# Patient Record
Sex: Male | Born: 1965 | Race: White | Hispanic: No | Marital: Single | Smoking: Current some day smoker
Health system: Southern US, Community
[De-identification: ages and names within clinical notes are randomized; demographics above are authoritative.]

## PROBLEM LIST (undated history)

## (undated) DIAGNOSIS — E119 Type 2 diabetes mellitus without complications: Secondary | ICD-10-CM

## (undated) DIAGNOSIS — E7800 Pure hypercholesterolemia, unspecified: Secondary | ICD-10-CM

## (undated) DIAGNOSIS — I10 Essential (primary) hypertension: Secondary | ICD-10-CM

## (undated) DIAGNOSIS — K219 Gastro-esophageal reflux disease without esophagitis: Secondary | ICD-10-CM

## (undated) HISTORY — DX: Essential (primary) hypertension: I10

## (undated) HISTORY — DX: Pure hypercholesterolemia, unspecified: E78.00

## (undated) HISTORY — PX: OTHER SURGICAL HISTORY: SHX169

## (undated) HISTORY — DX: Type 2 diabetes mellitus without complications: E11.9

## (undated) HISTORY — DX: Gastro-esophageal reflux disease without esophagitis: K21.9

---

## 2012-05-27 DIAGNOSIS — I219 Acute myocardial infarction, unspecified: Secondary | ICD-10-CM

## 2012-05-27 HISTORY — DX: Acute myocardial infarction, unspecified: I21.9

## 2012-08-27 DIAGNOSIS — I10 Essential (primary) hypertension: Secondary | ICD-10-CM | POA: Insufficient documentation

## 2013-05-27 DIAGNOSIS — I2510 Atherosclerotic heart disease of native coronary artery without angina pectoris: Secondary | ICD-10-CM

## 2013-05-27 HISTORY — DX: Atherosclerotic heart disease of native coronary artery without angina pectoris: I25.10

## 2013-05-27 HISTORY — PX: CORONARY ANGIOPLASTY WITH STENT PLACEMENT: SHX49

## 2013-10-05 DIAGNOSIS — Z720 Tobacco use: Secondary | ICD-10-CM | POA: Insufficient documentation

## 2014-03-14 DIAGNOSIS — E66811 Obesity, class 1: Secondary | ICD-10-CM | POA: Insufficient documentation

## 2014-03-14 DIAGNOSIS — E669 Obesity, unspecified: Secondary | ICD-10-CM | POA: Insufficient documentation

## 2015-02-07 DIAGNOSIS — I213 ST elevation (STEMI) myocardial infarction of unspecified site: Secondary | ICD-10-CM

## 2015-02-07 HISTORY — DX: ST elevation (STEMI) myocardial infarction of unspecified site: I21.3

## 2015-02-20 DIAGNOSIS — Z955 Presence of coronary angioplasty implant and graft: Secondary | ICD-10-CM | POA: Insufficient documentation

## 2015-05-11 HISTORY — PX: COLONOSCOPY: SHX174

## 2016-10-13 DIAGNOSIS — S39012A Strain of muscle, fascia and tendon of lower back, initial encounter: Secondary | ICD-10-CM | POA: Insufficient documentation

## 2016-10-23 ENCOUNTER — Telehealth (HOSPITAL_COMMUNITY): Payer: Self-pay

## 2016-10-23 NOTE — Telephone Encounter (Signed)
Conformation of appt was faxed to MD office to [facility] @ [phone]. NF 10/23/16

## 2016-10-30 ENCOUNTER — Ambulatory Visit (HOSPITAL_COMMUNITY): Payer: Self-pay | Attending: Sports Medicine

## 2016-10-30 ENCOUNTER — Encounter (HOSPITAL_COMMUNITY): Payer: Self-pay

## 2016-10-30 DIAGNOSIS — M545 Low back pain, unspecified: Secondary | ICD-10-CM

## 2016-10-30 DIAGNOSIS — R293 Abnormal posture: Secondary | ICD-10-CM

## 2016-10-30 DIAGNOSIS — R262 Difficulty in walking, not elsewhere classified: Secondary | ICD-10-CM
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
## 2016-10-30 NOTE — Patient Instructions (Signed)
  LOWER TRUNK ROTATIONS - LTR  Lying on your back with your knees bent, gently move your knees side-to-side.  Perform 1x/day 2-3 sets of 10 reps to each side. Can perform more during the day if you feel like it is helping   SINGLE KNEE TO CHEST STRETCH - SKTC  While Lying on your back, hold your knee and gently pull it up towards your chest.  Perform 1x/day, 1 to 2 sets of 10 reps on each side, holding for 10-15 seconds each   Child pose  On hands and knees, rock back onto heels stretching back as far as comfortable.  Perform 1x/day 5-10 reps, holding for 15-30 seconds each

## 2016-11-01 ENCOUNTER — Ambulatory Visit (HOSPITAL_COMMUNITY): Payer: Self-pay | Admitting: Physical Therapy

## 2016-11-01 DIAGNOSIS — M545 Low back pain, unspecified: Secondary | ICD-10-CM

## 2016-11-01 DIAGNOSIS — R293 Abnormal posture: Secondary | ICD-10-CM

## 2016-11-01 DIAGNOSIS — R262 Difficulty in walking, not elsewhere classified: Secondary | ICD-10-CM

## 2016-11-01 NOTE — Patient Instructions (Addendum)
Hamstring Stretch: Active    Support behind right knee. Starting with knee bent, attempt to straighten knee until a comfortable stretch is felt in back of thigh. Hold __30__ seconds. Repeat _3___ times per set. Do __1__ sets per session. Do ___2_ sessions per day.  http://orth.exer.us/158   Copyright  VHI. All rights reserved.  Bridging    Slowly raise buttocks from floor, keeping stomach tight. Repeat _10___ times per set. Do __1__ sets per session. Do ___2_ sessions per day.  http://orth.exer.us/1096   Copyright  VHI. All rights reserved.  Hip Abduction / Adduction: with Knee Flexion (Supine)    With right knee bent, gently lower knee to side and return. Repeat __10__ times per set. Do ___1_ sets per session. Do __2__ sessions per day.  http://orth.exer.us/682   Copyright  VHI. All rights reserved.  Getting Into / Out of Bed    Lower self to lie down on one side by raising legs and lowering head at the same time. Use arms to assist moving without twisting. Bend both knees to roll onto back if desired. To sit up, start from lying on side, and use same move-ments in reverse. Keep trunk aligned with legs.   Copyright  VHI. All rights reserved.  Stand to Sit / Sit to Stand    To sit: Bend knees to lower self onto front edge of chair, then scoot back on seat. To stand: Reverse sequence by placing one foot forward, and scoot to front of seat. Sit up straight and bring your shoulders over your knees; then use your legs and stand up  Copyright  VHI. All rights reserved.
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
## 2016-11-05 ENCOUNTER — Ambulatory Visit (HOSPITAL_COMMUNITY): Payer: Self-pay | Admitting: Physical Therapy

## 2016-11-05 DIAGNOSIS — M545 Low back pain, unspecified: Secondary | ICD-10-CM

## 2016-11-05 DIAGNOSIS — R293 Abnormal posture: Secondary | ICD-10-CM

## 2016-11-05 DIAGNOSIS — R262 Difficulty in walking, not elsewhere classified: Secondary | ICD-10-CM
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
## 2016-11-07 ENCOUNTER — Ambulatory Visit (HOSPITAL_COMMUNITY): Payer: Self-pay

## 2016-11-07 ENCOUNTER — Encounter (HOSPITAL_COMMUNITY): Payer: Self-pay

## 2016-11-07 DIAGNOSIS — R262 Difficulty in walking, not elsewhere classified: Secondary | ICD-10-CM

## 2016-11-07 DIAGNOSIS — M545 Low back pain, unspecified: Secondary | ICD-10-CM

## 2016-11-07 DIAGNOSIS — R293 Abnormal posture: Secondary | ICD-10-CM
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
## 2016-11-12 ENCOUNTER — Encounter (HOSPITAL_COMMUNITY): Payer: Self-pay

## 2016-11-12 ENCOUNTER — Ambulatory Visit (HOSPITAL_COMMUNITY): Payer: Self-pay

## 2016-11-12 DIAGNOSIS — R293 Abnormal posture: Secondary | ICD-10-CM

## 2016-11-12 DIAGNOSIS — M545 Low back pain, unspecified: Secondary | ICD-10-CM

## 2016-11-12 DIAGNOSIS — R262 Difficulty in walking, not elsewhere classified: Secondary | ICD-10-CM
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
## 2016-11-19 ENCOUNTER — Encounter (HOSPITAL_COMMUNITY): Payer: Self-pay | Admitting: Physical Therapy

## 2018-10-06 DIAGNOSIS — I10 Essential (primary) hypertension: Secondary | ICD-10-CM | POA: Diagnosis not present

## 2018-10-06 DIAGNOSIS — E1165 Type 2 diabetes mellitus with hyperglycemia: Secondary | ICD-10-CM | POA: Diagnosis not present

## 2018-10-06 DIAGNOSIS — Z125 Encounter for screening for malignant neoplasm of prostate: Secondary | ICD-10-CM | POA: Diagnosis not present

## 2018-11-02 DIAGNOSIS — I10 Essential (primary) hypertension: Secondary | ICD-10-CM | POA: Diagnosis not present

## 2018-11-02 DIAGNOSIS — Z125 Encounter for screening for malignant neoplasm of prostate: Secondary | ICD-10-CM | POA: Diagnosis not present

## 2018-11-02 DIAGNOSIS — E1165 Type 2 diabetes mellitus with hyperglycemia: Secondary | ICD-10-CM | POA: Diagnosis not present

## 2018-12-22 DIAGNOSIS — E119 Type 2 diabetes mellitus without complications: Secondary | ICD-10-CM | POA: Diagnosis not present

## 2018-12-22 DIAGNOSIS — I2510 Atherosclerotic heart disease of native coronary artery without angina pectoris: Secondary | ICD-10-CM | POA: Diagnosis not present

## 2018-12-22 DIAGNOSIS — I10 Essential (primary) hypertension: Secondary | ICD-10-CM | POA: Diagnosis not present

## 2018-12-22 DIAGNOSIS — Z7984 Long term (current) use of oral hypoglycemic drugs: Secondary | ICD-10-CM | POA: Diagnosis not present

## 2019-01-18 DIAGNOSIS — I2510 Atherosclerotic heart disease of native coronary artery without angina pectoris: Secondary | ICD-10-CM | POA: Diagnosis not present

## 2019-06-04 ENCOUNTER — Ambulatory Visit: Payer: BC Managed Care – PPO | Attending: Internal Medicine

## 2019-06-04 ENCOUNTER — Other Ambulatory Visit: Payer: Self-pay

## 2019-06-04 DIAGNOSIS — Z20822 Contact with and (suspected) exposure to covid-19: Secondary | ICD-10-CM

## 2019-06-05 LAB — NOVEL CORONAVIRUS, NAA: SARS-CoV-2, NAA: NOT DETECTED

## 2019-06-08 ENCOUNTER — Telehealth: Payer: Self-pay | Admitting: *Deleted

## 2019-06-08 NOTE — Telephone Encounter (Signed)
He called in requesting his COVID-19 test result.    I let him know it was not detected meaning he did not have the virus.  I sent him a link to sign up for MyChart.

## 2019-11-15 ENCOUNTER — Encounter (INDEPENDENT_AMBULATORY_CARE_PROVIDER_SITE_OTHER): Payer: Self-pay | Admitting: Nurse Practitioner

## 2019-11-15 ENCOUNTER — Other Ambulatory Visit: Payer: Self-pay

## 2019-11-15 ENCOUNTER — Ambulatory Visit (HOSPITAL_COMMUNITY)
Admission: RE | Admit: 2019-11-15 | Discharge: 2019-11-15 | Disposition: A | Discharge status: Home / Self Care | Payer: BC Managed Care – PPO | Source: Ambulatory Visit | Attending: Nurse Practitioner | Admitting: Nurse Practitioner

## 2019-11-15 ENCOUNTER — Ambulatory Visit (INDEPENDENT_AMBULATORY_CARE_PROVIDER_SITE_OTHER): Payer: BC Managed Care – PPO | Admitting: Nurse Practitioner

## 2019-11-15 VITALS — BP 140/90 | HR 95 | Temp 97.3°F | Ht 69.0 in | Wt 221.2 lb

## 2019-11-15 DIAGNOSIS — R05 Cough: Secondary | ICD-10-CM

## 2019-11-15 DIAGNOSIS — R053 Chronic cough: Secondary | ICD-10-CM

## 2019-11-15 DIAGNOSIS — I2510 Atherosclerotic heart disease of native coronary artery without angina pectoris: Secondary | ICD-10-CM

## 2019-11-15 DIAGNOSIS — I10 Essential (primary) hypertension: Secondary | ICD-10-CM

## 2019-11-15 DIAGNOSIS — E785 Hyperlipidemia, unspecified: Secondary | ICD-10-CM | POA: Diagnosis not present

## 2019-11-15 DIAGNOSIS — H6123 Impacted cerumen, bilateral: Secondary | ICD-10-CM

## 2019-11-15 DIAGNOSIS — J984 Other disorders of lung: Secondary | ICD-10-CM | POA: Diagnosis not present

## 2019-11-15 DIAGNOSIS — R7303 Prediabetes: Secondary | ICD-10-CM

## 2019-11-15 DIAGNOSIS — J90 Pleural effusion, not elsewhere classified: Secondary | ICD-10-CM | POA: Diagnosis not present

## 2019-11-15 NOTE — Progress Notes (Signed)
Subjective:  Patient ID: Gavin Harris, male    DOB: 08-23-1965  Age: 54 y.o. MRN: [id number]  CC:  Chief Complaint  Patient presents with  . Hypertension  . Establish Care  . right ear full of wax      HPI  This patient arrives today to establish care in this office.  He tells me he has not seen a primary care provider in approximately 1 year.  He tells me he was getting his care in [place], however he has been spending much of his time closer to [place] in the recent past.  He was recommended to come to our office by his significant other.  He tells me that he feels he has quite a bit of earwax in his right ear and is wondering if this can be removed.  He also mentions that he has a chronic intermittent cough that will sometimes be dry and will sometimes produce sputum.  He denies any shortness of breath, chest pain, exercise intolerance, but he is wondering if he can have an x-ray completed mainly because his girlfriend is concerned.  He does report a 39-year history of smoking and tells me that on average he has smoked 1 pack/day.  Is to make his pack year approximately 70.   History reviewed. No pertinent past medical history.    History reviewed. No pertinent family history.  Social History   Social History Narrative  . Not on file   Social History   Tobacco Use  . Smoking status: Light Tobacco Smoker    Types: Cigarettes  . Smokeless tobacco: Never Used  Substance Use Topics  . Alcohol use: Not Currently     Current Meds  Medication Sig  . aspirin 81 MG EC tablet Take 81 mg by mouth daily.  Marland Kitchen atorvastatin (LIPITOR) 80 MG tablet Take 80 mg by mouth daily.  . clopidogrel (PLAVIX) 75 MG tablet Take 75 mg by mouth daily.  Marland Kitchen glipiZIDE (GLUCOTROL) 5 MG tablet Take 5 mg by mouth daily.  Marland Kitchen losartan-hydrochlorothiazide (HYZAAR) 100-12.5 MG tablet Take 1 tablet by mouth daily.  . metFORMIN (GLUCOPHAGE) 1000 MG tablet Take 1,000 mg by mouth 2 (two) times  daily.  . Multiple Vitamin (MULTI-VITAMIN) tablet Take 1 tablet by mouth daily.  Marland Kitchen OVER THE COUNTER MEDICATION Take 3,000 mg by mouth daily. Nugenix Ultimate    ROS:  Review of Systems  Constitutional: Negative for fever and malaise/fatigue.  HENT:       (+)ear wax to right ear  Eyes: Negative for blurred vision.  Respiratory: Positive for cough (intermittent; usually dry) and sputum production (intermittent ). Negative for shortness of breath and wheezing.   Cardiovascular: Negative for chest pain.  Gastrointestinal: Negative for abdominal pain and blood in stool.  Neurological: Negative for dizziness and headaches.     Objective:   Today's Vitals: BP 140/90 (BP Location: Left Arm, Patient Position: Sitting, Cuff Size: Normal)   Pulse 95   Temp (!) 97.3 F (36.3 C) (Temporal)   Ht _0  (1.753 m)   Wt 221 lb 3.2 oz (100.3 kg)   SpO2 97%   BMI 32.67 kg/m  Vitals with BMI 11/15/2019  Height _1   Weight 221 lbs 3 oz  BMI 02.11  Systolic 155  Diastolic 90  Pulse 95     Physical Exam Vitals reviewed.  Constitutional:      Appearance: Normal appearance.  HENT:     Head: Normocephalic and atraumatic.  Cardiovascular:  Rate and Rhythm: Normal rate and regular rhythm.  Pulmonary:     Effort: Pulmonary effort is normal.     Breath sounds: Normal breath sounds.  Musculoskeletal:     Cervical back: Neck supple.  Skin:    General: Skin is warm and dry.  Neurological:     Mental Status: He is alert and oriented to person, place, and time.  Psychiatric:        Mood and Affect: Mood normal.        Behavior: Behavior normal.        Thought Content: Thought content normal.        Judgment: Judgment normal.          Assessment and Plan   1. Chronic cough   2. Coronary artery disease involving native heart without angina pectoris, unspecified vessel or lesion type   3. Hyperlipidemia, unspecified hyperlipidemia type   4. Hypertension, unspecified type   5.  Prediabetes   6. Bilateral impacted cerumen      Plan: 1.  He is currently under the age for recommended routine lung cancer screening via CT scan.  However with his history of a chronic cough, I am willing to send him for chest x-ray for further evaluation.  We will provide him with this order.  2.-5.  He will continue on his current medications, blood pressure is mildly above goal.  However we will not make changes today to his chronic medications.  I will collect blood work for further evaluation today.  He will follow-up in approximately 1 month to discuss all blood work results.  6.  I attempted to disimpact cerumen in his right ear today, however I was unable to remove all the wax.  I recommend that he use Debrox drops (5 drops to each ear daily for approximately 4 days) prior to his follow-up at which point we will reattempt impaction removal.   Tests ordered Orders Placed This Encounter  Procedures  . DG Chest 2 View  . CBC  . CMP with eGFR([facility])  . Lipid Panel  . Hemoglobin A1c  . TSH      No orders of the defined types were placed in this encounter.   Patient to follow-up in 1 month or sooner as needed  Ailene Ards, NP

## 2019-11-19 DIAGNOSIS — R7303 Prediabetes: Secondary | ICD-10-CM | POA: Diagnosis not present

## 2019-11-19 DIAGNOSIS — I10 Essential (primary) hypertension: Secondary | ICD-10-CM | POA: Diagnosis not present

## 2019-11-19 DIAGNOSIS — I2510 Atherosclerotic heart disease of native coronary artery without angina pectoris: Secondary | ICD-10-CM | POA: Diagnosis not present

## 2019-11-19 DIAGNOSIS — E785 Hyperlipidemia, unspecified: Secondary | ICD-10-CM | POA: Diagnosis not present

## 2019-11-20 LAB — TSH: TSH: 1.84 m[IU]/L (ref 0.40–4.50)

## 2019-11-20 LAB — COMPLETE METABOLIC PANEL WITH GFR
AG Ratio: 1.9 (calc) (ref 1.0–2.5)
ALT: 44 U/L (ref 9–46)
AST: 19 U/L (ref 10–35)
Albumin: 4.3 g/dL (ref 3.6–5.1)
Alkaline phosphatase (APISO): 95 U/L (ref 35–144)
BUN: 18 mg/dL (ref 7–25)
CO2: 28 mmol/L (ref 20–32)
Calcium: 9.4 mg/dL (ref 8.6–10.3)
Chloride: 105 mmol/L (ref 98–110)
Creat: 0.77 mg/dL (ref 0.70–1.33)
GFR, Est African American: 120 mL/min/{1.73_m2} (ref >=60)
GFR, Est Non African American: 104 mL/min/{1.73_m2} (ref >=60)
Globulin: 2.3 g/dL (calc) (ref 1.9–3.7)
Glucose, Bld: 259 mg/dL — ABNORMAL HIGH (ref 65–99)
Potassium: 4.1 mmol/L (ref 3.5–5.3)
Sodium: 139 mmol/L (ref 135–146)
Total Bilirubin: 0.4 mg/dL (ref 0.2–1.2)
Total Protein: 6.6 g/dL (ref 6.1–8.1)

## 2019-11-20 LAB — CBC
HCT: 42.5 % (ref 38.5–50.0)
Hemoglobin: 14.1 g/dL (ref 13.2–17.1)
MCH: 30.8 pg (ref 27.0–33.0)
MCHC: 33.2 g/dL (ref 32.0–36.0)
MCV: 92.8 fL (ref 80.0–100.0)
MPV: 12.8 fL — ABNORMAL HIGH (ref 7.5–12.5)
Platelets: 143 10*3/uL (ref 140–400)
RBC: 4.58 10*6/uL (ref 4.20–5.80)
RDW: 12.5 % (ref 11.0–15.0)
WBC: 7.5 10*3/uL (ref 3.8–10.8)

## 2019-11-20 LAB — HEMOGLOBIN A1C
Hgb A1c MFr Bld: 9.1 % of total Hgb — ABNORMAL HIGH (ref <5.7)
Mean Plasma Glucose: 214 (calc)
eAG (mmol/L): 11.9 (calc)

## 2019-11-20 LAB — LIPID PANEL
Cholesterol: 111 mg/dL
HDL: 32 mg/dL — ABNORMAL LOW
LDL Cholesterol (Calc): 56 mg/dL
Non-HDL Cholesterol (Calc): 79 mg/dL
Total CHOL/HDL Ratio: 3.5 (calc)
Triglycerides: 157 mg/dL — ABNORMAL HIGH

## 2019-11-30 ENCOUNTER — Other Ambulatory Visit (INDEPENDENT_AMBULATORY_CARE_PROVIDER_SITE_OTHER): Payer: Self-pay

## 2019-11-30 MED ORDER — METFORMIN HCL 1000 MG PO TABS: 1000.0000 mg | ORAL_TABLET | Freq: Two times a day (BID) | ORAL | 0 refills | Status: DC

## 2019-11-30 MED ORDER — ATORVASTATIN CALCIUM 80 MG PO TABS: 80.0000 mg | ORAL_TABLET | Freq: Every day | ORAL | 0 refills | Status: DC

## 2019-11-30 MED ORDER — LOSARTAN POTASSIUM-HCTZ 100-12.5 MG PO TABS: 1.0000 | ORAL_TABLET | Freq: Every day | ORAL | 0 refills | Status: DC

## 2019-11-30 MED ORDER — GLIPIZIDE 5 MG PO TABS: 5.0000 mg | ORAL_TABLET | Freq: Every day | ORAL | 0 refills | Status: DC

## 2019-12-02 ENCOUNTER — Other Ambulatory Visit (INDEPENDENT_AMBULATORY_CARE_PROVIDER_SITE_OTHER): Payer: Self-pay

## 2019-12-02 ENCOUNTER — Other Ambulatory Visit (INDEPENDENT_AMBULATORY_CARE_PROVIDER_SITE_OTHER): Payer: Self-pay | Admitting: Nurse Practitioner

## 2019-12-02 ENCOUNTER — Telehealth (INDEPENDENT_AMBULATORY_CARE_PROVIDER_SITE_OTHER): Payer: Self-pay

## 2019-12-02 MED ORDER — ATORVASTATIN CALCIUM 80 MG PO TABS: 80.0000 mg | ORAL_TABLET | Freq: Every day | ORAL | 0 refills | Status: DC

## 2019-12-02 MED ORDER — GLIPIZIDE 5 MG PO TABS: 5.0000 mg | ORAL_TABLET | Freq: Every day | ORAL | 0 refills | Status: DC

## 2019-12-02 MED ORDER — LOSARTAN POTASSIUM-HCTZ 100-12.5 MG PO TABS: 1.0000 | ORAL_TABLET | Freq: Every day | ORAL | 0 refills | Status: DC

## 2019-12-02 MED ORDER — CLOPIDOGREL BISULFATE 75 MG PO TABS: 75.0000 mg | ORAL_TABLET | Freq: Every day | ORAL | 0 refills | Status: DC

## 2019-12-02 MED ORDER — METFORMIN HCL 1000 MG PO TABS: 1000.0000 mg | ORAL_TABLET | Freq: Two times a day (BID) | ORAL | 0 refills | Status: DC

## 2019-12-02 NOTE — Telephone Encounter (Signed)
I think this one is for you Judson Roch since you saw the patient.  Thanks.

## 2019-12-02 NOTE — Telephone Encounter (Signed)
Gavin Harris, CMA  

## 2019-12-02 NOTE — Telephone Encounter (Signed)
From what I can see he never requested refills be ordered at his initial visit. So they were most likely never sent, but I have sent prescriptions to [facility] in [place].

## 2019-12-02 NOTE — Telephone Encounter (Signed)
[facility] in [place] is stating that they havent received his refills, please advise?

## 2019-12-14 ENCOUNTER — Encounter (INDEPENDENT_AMBULATORY_CARE_PROVIDER_SITE_OTHER): Payer: Self-pay | Admitting: Nurse Practitioner

## 2019-12-14 ENCOUNTER — Ambulatory Visit (INDEPENDENT_AMBULATORY_CARE_PROVIDER_SITE_OTHER): Payer: BC Managed Care – PPO | Admitting: Nurse Practitioner

## 2019-12-14 ENCOUNTER — Other Ambulatory Visit: Payer: Self-pay

## 2019-12-14 VITALS — BP 145/85 | HR 100 | Temp 96.1°F | Ht 69.0 in | Wt 224.4 lb

## 2019-12-14 DIAGNOSIS — Z1211 Encounter for screening for malignant neoplasm of colon: Secondary | ICD-10-CM | POA: Diagnosis not present

## 2019-12-14 DIAGNOSIS — N529 Male erectile dysfunction, unspecified: Secondary | ICD-10-CM | POA: Diagnosis not present

## 2019-12-14 DIAGNOSIS — Z1159 Encounter for screening for other viral diseases: Secondary | ICD-10-CM | POA: Diagnosis not present

## 2019-12-14 DIAGNOSIS — Z0001 Encounter for general adult medical examination with abnormal findings: Secondary | ICD-10-CM

## 2019-12-14 DIAGNOSIS — I2510 Atherosclerotic heart disease of native coronary artery without angina pectoris: Secondary | ICD-10-CM | POA: Diagnosis not present

## 2019-12-14 DIAGNOSIS — E1165 Type 2 diabetes mellitus with hyperglycemia: Secondary | ICD-10-CM

## 2019-12-14 NOTE — Progress Notes (Signed)
Subjective:  Patient ID: Gavin Harris, male    DOB: 1965/08/29  Age: 54 y.o. MRN: [id number]  CC:  Chief Complaint  Patient presents with  . Annual Exam  . Diabetes  . Coronary Artery Disease  . Other    Erectile Dysfunction      HPI  This patient comes in today for the above.    Annual physical exam: As far as health maintenance is concerned he is not sure when his last tetanus shot was administered, he would be due for shingles vaccine and COVID-19 vaccinations.  He believes he is due for colon cancer screening, he tells me he thinks he had a colonoscopy back approximately 5 years ago at which time he believes they recommended repeating in 5 to 10 years.  He has had sexual transmitted infection screening in the past he tells me he does not want repeat this today.  He is a current tobacco smoker and smokes approximately half a pack per day.  He tells me he has been doing this for approximately 35 to 40 years thus, his pack year is still less than 60.  He is due for depression screening.  He is due for hepatitis C screening.  Diabetes: Blood work was collected at his initial office visit it did show that his A1c is approximately 9.  He continues on Metformin and glipizide.  He does have a glucometer at home but does not check his blood sugars regularly.  He tells me that he can usually tell when he has either elevated or low blood sugar he is not experienced any symptoms of this here recently.  Coronary artery disease: He also mentions that approximately 5 to 6 years ago he had a myocardial infarction and did have stent placement at [facility].  He has not seen a cardiologist close to 2 years and would like to be referred to cardiology for assistance with managing his coronary artery disease.  He does deny any chest pain, palpitations, worsening shortness of breath.  Erectile dysfunction: He is wondering if he can referred for further evaluation of erectile dysfunction.  He tells me he  has used Cialis in the past, and would like to have this prescribed again today.  He tells me he has been difficulty is obtaining an erection.     Past Medical History:  Diagnosis Date  . CAD (coronary artery disease) 2015   Stent placement  . Diabetes mellitus without complication ([place])   . Myocardial infarction [facility])       No family history on file.  Social History   Social History Narrative  . Not on file   Social History   Tobacco Use  . Smoking status: Light Tobacco Smoker    Types: Cigarettes  . Smokeless tobacco: Never Used  Substance Use Topics  . Alcohol use: Not Currently     Current Meds  Medication Sig  . aspirin 81 MG EC tablet Take 81 mg by mouth daily.  Marland Kitchen atorvastatin (LIPITOR) 80 MG tablet Take 1 tablet (80 mg total) by mouth daily.  . clopidogrel (PLAVIX) 75 MG tablet Take 1 tablet (75 mg total) by mouth daily.  . empagliflozin (JARDIANCE) 10 MG TABS tablet Take 10 mg by mouth daily.  Marland Kitchen glipiZIDE (GLUCOTROL) 5 MG tablet Take 1 tablet (5 mg total) by mouth daily.  Marland Kitchen losartan-hydrochlorothiazide (HYZAAR) 100-12.5 MG tablet Take 1 tablet by mouth daily.  . metFORMIN (GLUCOPHAGE) 1000 MG tablet Take 1 tablet (1,000 mg total)  by mouth 2 (two) times daily.  . Multiple Vitamin (MULTI-VITAMIN) tablet Take 1 tablet by mouth daily.  Marland Kitchen OVER THE COUNTER MEDICATION Take 3,000 mg by mouth daily. Nugenix Ultimate    ROS:  Review of Systems  Constitutional: Negative for fever, malaise/fatigue and weight loss.  Eyes: Negative for blurred vision.  Respiratory: Positive for cough. Negative for sputum production and wheezing.   Cardiovascular: Negative for chest pain and palpitations.  Gastrointestinal: Negative for abdominal pain and blood in stool.  Genitourinary: Negative for dysuria and urgency.       (+) difficulty obtaining an erection  Neurological: Negative for dizziness and headaches.  Endo/Heme/Allergies: Negative for polydipsia.     Objective:    Today's Vitals: BP (!) 145/85 (BP Location: Left Arm, Patient Position: Sitting, Cuff Size: Normal)   Pulse 100   Temp (!) 96.1 F (35.6 C) (Temporal)   Ht 5\' 9"  (1.753 m)   Wt 224 lb 6.4 oz (101.8 kg)   SpO2 97%   BMI 33.14 kg/m  Vitals with BMI 12/14/2019 11/15/2019  Height 5\' 9"  5\' 9"   Weight 224 lbs 6 oz 221 lbs 3 oz  BMI 17.00 17.49  Systolic 449 675  Diastolic 85 90  Pulse 916 95     Physical Exam Vitals reviewed.  Constitutional:      General: He is not in acute distress.    Appearance: Normal appearance. He is not ill-appearing.  HENT:     Head: Normocephalic and atraumatic.     Right Ear: Ear canal and external ear normal. Decreased hearing noted. There is impacted cerumen.     Left Ear: Tympanic membrane, ear canal and external ear normal. Decreased hearing noted.  Eyes:     General: No scleral icterus.    Extraocular Movements: Extraocular movements intact.     Conjunctiva/sclera: Conjunctivae normal.     Pupils: Pupils are equal, round, and reactive to light.  Neck:     Vascular: No carotid bruit.  Cardiovascular:     Rate and Rhythm: Normal rate and regular rhythm.     Pulses: Normal pulses.          Dorsalis pedis pulses are 2+ on the right side and 2+ on the left side.     Heart sounds: Normal heart sounds.  Pulmonary:     Effort: Pulmonary effort is normal.     Breath sounds: Normal breath sounds.  Abdominal:     General: Bowel sounds are normal. There is no distension.     Palpations: There is no mass.     Tenderness: There is no abdominal tenderness.     Hernia: No hernia is present.  Musculoskeletal:        General: No swelling or tenderness.     Cervical back: Normal range of motion and neck supple. No rigidity.     Right foot: No deformity.     Left foot: No deformity.  Feet:     Right foot:     Protective Sensation: 10 sites tested. 7 sites sensed.     Skin integrity: Skin integrity normal.     Toenail Condition: Right toenails are  normal.     Left foot:     Protective Sensation: 10 sites tested. 7 sites sensed.     Skin integrity: Skin integrity normal.     Toenail Condition: Left toenails are normal.  Lymphadenopathy:     Cervical: No cervical adenopathy.  Skin:    General: Skin is warm and dry.  Neurological:     General: No focal deficit present.     Mental Status: He is alert and oriented to person, place, and time.     Cranial Nerves: No cranial nerve deficit.     Sensory: No sensory deficit.     Motor: No weakness.     Gait: Gait normal.  Psychiatric:        Mood and Affect: Mood normal.        Behavior: Behavior normal.        Judgment: Judgment normal.      PHQ9 SCORE ONLY 12/14/2019 12/14/2019  PHQ-9 Total Score 0 0       Assessment and Plan   1. Encounter for general adult medical examination with abnormal findings   2. Coronary artery disease involving native heart without angina pectoris, unspecified vessel or lesion type   3. Erectile dysfunction, unspecified erectile dysfunction type   4. Colon cancer screening   5. Encounter for hepatitis C screening test for low risk patient   6. Type 2 diabetes mellitus with hyperglycemia, without long-term current use of insulin (HCC)      Plan: 1., 4.-5.  I will have him sign a ROI to get previous records from his previous primary care provider to see what his immunization records show.  We did discuss shingles and COVID-19 vaccinations and if you would like to have these administered please notify us.  He is willing to undergo colon cancer screening and would like referral to gastroenterologist.  Will not do sexual transmitted infection screening.  We did discuss tobacco cessation and he is trying to quit and has already cut back a bit.  He plans on using either nicotine gum or lozenges to help with cravings.  He would like to be screened for hepatitis C.  Depression screening was completed today and was negative.  2.-3.  I will refer him to  cardiology for assistance with managing his coronary artery disease.  He will continue on his atorvastatin, aspirin, and Plavix in the meantime.  I did encourage him to discuss his erectile dysfunction with cardiology, but I also refer him to urology for further evaluation and management of his erectile dysfunction.  We will hold off on prescribing him Cialis today.  He tells me he does not take any nitroglycerin as far as he knows, however he does not have his medications with him today so I am going to await further evaluation via cardiologist and neurologist before prescribing the Cialis.  6.  I did discuss his A1c and that it is quite above goal.  Per shared decision making he would like to try Jardiance in addition to his Metformin and glipizide.  I will provide him with samples of the 10 mg tablet and I did tell him to take 1 tab by mouth daily.  We also discussed common side effects and I warned him about Fournier gangrene and that if he experiences any rash, pain, dysuria, urinary frequency, hematuria that he should stop the medication let us know.  I also told him that he should check a fasting blood sugar every day and keep a log of this.  I asked him to bring this back with him at his follow-up.  He will follow-up in 2 weeks at which point we will recheck CMP and look at his at home blood sugar logs.  He will continue on his atorvastatin and losartan.   Tests ordered Orders Placed This Encounter  Procedures  . Hep C  Antibody  . Ambulatory referral to Cardiology  . Ambulatory referral to Urology  . Ambulatory referral to Gastroenterology      No orders of the defined types were placed in this encounter.   Patient to follow-up in 2 weeks, in addition to performing his annual physical exam I also performed an office visit to address his chronic conditions.  Ailene Ards, NP

## 2019-12-15 ENCOUNTER — Encounter (INDEPENDENT_AMBULATORY_CARE_PROVIDER_SITE_OTHER): Payer: BC Managed Care – PPO | Admitting: Nurse Practitioner

## 2019-12-17 ENCOUNTER — Encounter: Payer: Self-pay | Admitting: Internal Medicine

## 2019-12-28 ENCOUNTER — Ambulatory Visit (INDEPENDENT_AMBULATORY_CARE_PROVIDER_SITE_OTHER): Payer: BC Managed Care – PPO | Admitting: Nurse Practitioner

## 2020-01-10 ENCOUNTER — Encounter (INDEPENDENT_AMBULATORY_CARE_PROVIDER_SITE_OTHER): Payer: Self-pay | Admitting: Nurse Practitioner

## 2020-01-10 ENCOUNTER — Other Ambulatory Visit: Payer: Self-pay

## 2020-01-10 ENCOUNTER — Ambulatory Visit (INDEPENDENT_AMBULATORY_CARE_PROVIDER_SITE_OTHER): Payer: BC Managed Care – PPO | Admitting: Nurse Practitioner

## 2020-01-10 VITALS — BP 118/75 | HR 68 | Temp 97.3°F | Ht 69.0 in | Wt 218.6 lb

## 2020-01-10 DIAGNOSIS — Z1159 Encounter for screening for other viral diseases: Secondary | ICD-10-CM

## 2020-01-10 DIAGNOSIS — E1165 Type 2 diabetes mellitus with hyperglycemia: Secondary | ICD-10-CM | POA: Diagnosis not present

## 2020-01-10 MED ORDER — EMPAGLIFLOZIN 10 MG PO TABS: 10.0000 mg | ORAL_TABLET | Freq: Every day | ORAL | 1 refills | Status: DC

## 2020-01-10 NOTE — Progress Notes (Signed)
Subjective:  Patient ID: Gavin Harris, male    DOB: September 15, 1965  Age: 54 y.o. MRN: [id number]  CC:  Chief Complaint  Patient presents with  . Diabetes  . Follow-up      HPI  This patient arrives today for the above.  Approximately 6 weeks ago patient's lab work showed A1c of 9.1.  At that time we added Jardiance to his medication regimen.  He also continues on glipizide 5 mg daily and Metformin 1000 mg twice daily.  He also is on statin and losartan.  He is tolerating the Jardiance well since he started taking it.  He tells me he has not noticed any increase in urination or other negative side effects.  He tells me that he forgot to bring his glucose logs with him to the office today, however says that his blood sugar is generally running between 150-177.  He denies any hypoglycemia since his last office visit.  Of note I do see that he has not been screened for hepatitis C as part of his health maintenance.  He is due to discuss this today as well.   Past Medical History:  Diagnosis Date  . CAD (coronary artery disease) 2015   Stent placement  . Diabetes mellitus without complication ([place])   . Myocardial infarction [facility])       No family history on file.  Social History   Social History Narrative  . Not on file   Social History   Tobacco Use  . Smoking status: Light Tobacco Smoker    Types: Cigarettes  . Smokeless tobacco: Never Used  Substance Use Topics  . Alcohol use: Not Currently     Current Meds  Medication Sig  . aspirin 81 MG EC tablet Take 81 mg by mouth daily.  Marland Kitchen atorvastatin (LIPITOR) 80 MG tablet Take 1 tablet (80 mg total) by mouth daily.  . clopidogrel (PLAVIX) 75 MG tablet Take 1 tablet (75 mg total) by mouth daily.  . empagliflozin (JARDIANCE) 10 MG TABS tablet Take 1 tablet (10 mg total) by mouth daily.  Marland Kitchen glipiZIDE (GLUCOTROL) 5 MG tablet Take 1 tablet (5 mg total) by mouth daily.  Marland Kitchen losartan-hydrochlorothiazide (HYZAAR) 100-12.5 MG  tablet Take 1 tablet by mouth daily.  . metFORMIN (GLUCOPHAGE) 1000 MG tablet Take 1 tablet (1,000 mg total) by mouth 2 (two) times daily.  . Multiple Vitamin (MULTI-VITAMIN) tablet Take 1 tablet by mouth daily.  Marland Kitchen OVER THE COUNTER MEDICATION Take 3,000 mg by mouth daily. Nugenix Ultimate  . [DISCONTINUED] empagliflozin (JARDIANCE) 10 MG TABS tablet Take 10 mg by mouth daily.    ROS:  Review of Systems  Eyes: Negative for blurred vision.  Respiratory: Negative for shortness of breath.   Cardiovascular: Negative for chest pain.  Neurological: Negative for dizziness and headaches.  Endo/Heme/Allergies: Negative for polydipsia.     Objective:   Today's Vitals: BP 118/75 (BP Location: Left Arm, Patient Position: Sitting, Cuff Size: Normal)   Pulse 68   Temp (!) 97.3 F (36.3 C) (Temporal)   Ht 5' 9" (1.753 m)   Wt 218 lb 9.6 oz (99.2 kg)   SpO2 97%   BMI 32.28 kg/m  Vitals with BMI 01/10/2020 12/14/2019 11/15/2019  Height 5' 9" 5' 9" 5' 9"  Weight 218 lbs 10 oz 224 lbs 6 oz 221 lbs 3 oz  BMI 32.27 32.99 24.26  Systolic 834 196 222  Diastolic 75 85 90  Pulse 68 100 95  Physical Exam Vitals reviewed.  Constitutional:      Appearance: Normal appearance.  HENT:     Head: Normocephalic and atraumatic.  Cardiovascular:     Rate and Rhythm: Normal rate and regular rhythm.  Pulmonary:     Effort: Pulmonary effort is normal.     Breath sounds: Normal breath sounds.  Musculoskeletal:     Cervical back: Neck supple.  Skin:    General: Skin is warm and dry.  Neurological:     Mental Status: He is alert and oriented to person, place, and time.  Psychiatric:        Mood and Affect: Mood normal.        Behavior: Behavior normal.        Thought Content: Thought content normal.        Judgment: Judgment normal.          Assessment and Plan   1. Type 2 diabetes mellitus with hyperglycemia, without long-term current use of insulin ([place])   2. Encounter for hepatitis C  screening test for low risk patient      Plan: 1.  He will continue on his current medications including the Jardiance.  I will send refill to his pharmacy today.  We will collect CMP to monitor kidney function.  He will be due for A1c recheck at next office visit. 2.  Did discuss hepatitis C screening including that if his screen came back positive it would result in my recommendation to follow-up with gastroenterology for treatment for hepatitis C.  He is interested in having screening completed today.  We will order test today.  Recommendations will be made based on results.   Tests ordered Orders Placed This Encounter  Procedures  . CMP with eGFR([facility])  . Hep C Antibody      Meds ordered this encounter  Medications  . empagliflozin (JARDIANCE) 10 MG TABS tablet    Sig: Take 1 tablet (10 mg total) by mouth daily.    Dispense:  90 tablet    Refill:  1    Order Specific Question:   Supervising Provider    Answer:   Doree Albee [[id number]]    Patient to follow-up in 2 months at which point A1c will be checked  Ailene Ards, NP

## 2020-01-10 NOTE — Patient Instructions (Signed)
Go to: [email or web address]  This is for the jardiance copay card if total cost of jardiance is too high. This should provide you with card to get jardiance for 10$

## 2020-01-11 ENCOUNTER — Other Ambulatory Visit (INDEPENDENT_AMBULATORY_CARE_PROVIDER_SITE_OTHER): Payer: Self-pay | Admitting: Nurse Practitioner

## 2020-01-11 DIAGNOSIS — E1165 Type 2 diabetes mellitus with hyperglycemia: Secondary | ICD-10-CM

## 2020-01-11 LAB — COMPLETE METABOLIC PANEL WITH GFR
AG Ratio: 1.9 (calc) (ref 1.0–2.5)
ALT: 34 U/L (ref 9–46)
AST: 22 U/L (ref 10–35)
Albumin: 4.9 g/dL (ref 3.6–5.1)
Alkaline phosphatase (APISO): 80 U/L (ref 35–144)
BUN/Creatinine Ratio: 30 (calc) — ABNORMAL HIGH (ref 6–22)
BUN: 32 mg/dL — ABNORMAL HIGH (ref 7–25)
CO2: 30 mmol/L (ref 20–32)
Calcium: 9.9 mg/dL (ref 8.6–10.3)
Chloride: 96 mmol/L — ABNORMAL LOW (ref 98–110)
Creat: 1.05 mg/dL (ref 0.70–1.33)
GFR, Est African American: 93 mL/min/{1.73_m2} (ref >=60)
GFR, Est Non African American: 80 mL/min/{1.73_m2} (ref >=60)
Globulin: 2.6 g/dL (calc) (ref 1.9–3.7)
Glucose, Bld: 124 mg/dL — ABNORMAL HIGH (ref 65–99)
Potassium: 3.8 mmol/L (ref 3.5–5.3)
Sodium: 135 mmol/L (ref 135–146)
Total Bilirubin: 1 mg/dL (ref 0.2–1.2)
Total Protein: 7.5 g/dL (ref 6.1–8.1)

## 2020-01-11 LAB — HEPATITIS C ANTIBODY
Hepatitis C Ab: NONREACTIVE
SIGNAL TO CUT-OFF: 0.02 (ref <1.00)

## 2020-01-20 ENCOUNTER — Ambulatory Visit: Payer: BC Managed Care – PPO

## 2020-02-01 ENCOUNTER — Other Ambulatory Visit (INDEPENDENT_AMBULATORY_CARE_PROVIDER_SITE_OTHER): Payer: Self-pay | Admitting: Nurse Practitioner

## 2020-02-01 ENCOUNTER — Encounter: Payer: Self-pay | Admitting: Urology

## 2020-02-01 ENCOUNTER — Other Ambulatory Visit: Payer: Self-pay

## 2020-02-01 ENCOUNTER — Ambulatory Visit (INDEPENDENT_AMBULATORY_CARE_PROVIDER_SITE_OTHER): Payer: BC Managed Care – PPO | Admitting: Urology

## 2020-02-01 VITALS — BP 153/97 | HR 81 | Temp 98.8°F | Wt 218.0 lb

## 2020-02-01 DIAGNOSIS — N529 Male erectile dysfunction, unspecified: Secondary | ICD-10-CM

## 2020-02-01 LAB — URINALYSIS, ROUTINE W REFLEX MICROSCOPIC
Bilirubin, UA: NEGATIVE
Ketones, UA: NEGATIVE
Leukocytes,UA: NEGATIVE
Nitrite, UA: NEGATIVE
Protein,UA: NEGATIVE
RBC, UA: NEGATIVE
Specific Gravity, UA: 1.025 (ref 1.005–1.030)
Urobilinogen, Ur: 1 mg/dL (ref 0.2–1.0)
pH, UA: 6.5 (ref 5.0–7.5)

## 2020-02-01 MED ORDER — TADALAFIL 20 MG PO TABS: 20.0000 mg | ORAL_TABLET | ORAL | 11 refills | Status: DC | PRN

## 2020-02-01 NOTE — Progress Notes (Signed)
Urological Symptom Review  Patient is experiencing the following symptoms: Frequent urination Get up at night to urinate Leakage of urine Stream starts and stops Trouble starting stream   Review of Systems  Gastrointestinal (upper)  : Negative for upper GI symptoms  Gastrointestinal (lower) : Negative for lower GI symptoms  Constitutional : Negative for symptoms  Skin: Skin rash/lesion  Eyes: Negative for eye symptoms  Ear/Nose/Throat : Negative for Ear/Nose/Throat symptoms  Hematologic/Lymphatic: Easy bruising  Cardiovascular : Negative for cardiovascular symptoms  Respiratory : Negative for respiratory symptoms  Endocrine: Negative for endocrine symptoms  Musculoskeletal: Negative for musculoskeletal symptoms  Neurological: Negative for neurological symptoms  Psychologic: Negative for psychiatric symptoms

## 2020-02-01 NOTE — Progress Notes (Signed)
H&P  Chief Complaint: ED Referral  History of Present Illness:  9.7.2021: Pt has been taking cialis 20mg  for ED (onset 5 years ago and taking medication for the last 2 years). Pt was referred for a urology consultation in order to establish care following pt relocation.  Pt reports that he had cardiac stent placed approximately 5 years ago following a heart attack but does not take nitro at this time. He is diabetic and smokes.  IPSS Questionnaire (AUA-7): Over the past month.   1)  How often have you had a sensation of not emptying your bladder completely after you finish urinating?  0 - Not at all  2)  How often have you had to urinate again less than two hours after you finished urinating? 2 - Less than half the time  3)  How often have you found you stopped and started again several times when you urinated?  1 - Less than 1 time in 5  4) How difficult have you found it to postpone urination?  1 - Less than 1 time in 5  5) How often have you had a weak urinary stream?  0 - Not at all  6) How often have you had to push or strain to begin urination?  1 - Less than 1 time in 5  7) How many times did you most typically get up to urinate from the time you went to bed until the time you got up in the morning?  2 - 2 times  Total score:  0-7 mildly symptomatic   8-19 moderately symptomatic   20-35 severely symptomatic   QOL score: 3   Past Medical History:  Diagnosis Date  . CAD (coronary artery disease) 2015   Stent placement  . Diabetes mellitus without complication ([place])   . Myocardial infarction [facility])     Past Surgical History:  Procedure Laterality Date  . CORONARY ANGIOPLASTY WITH STENT PLACEMENT  2015    Home Medications:  Allergies as of 02/01/2020      Reactions   Penicillins Hives, Other (See Comments)   Mother told him      Medication List       Accurate as of February 01, 2020  9:20 AM. If you have any questions, ask your nurse or doctor.        aspirin 81  MG EC tablet Take 81 mg by mouth daily.   atorvastatin 80 MG tablet Commonly known as: LIPITOR Take 1 tablet (80 mg total) by mouth daily.   clopidogrel 75 MG tablet Commonly known as: PLAVIX Take 1 tablet (75 mg total) by mouth daily.   empagliflozin 10 MG Tabs tablet Commonly known as: JARDIANCE Take 1 tablet (10 mg total) by mouth daily.   glipiZIDE 5 MG tablet Commonly known as: GLUCOTROL Take 1 tablet (5 mg total) by mouth daily.   losartan-hydrochlorothiazide 100-12.5 MG tablet Commonly known as: HYZAAR Take 1 tablet by mouth daily.   metFORMIN 1000 MG tablet Commonly known as: GLUCOPHAGE Take 1 tablet (1,000 mg total) by mouth 2 (two) times daily.   Multi-Vitamin tablet Take 1 tablet by mouth daily.   OVER THE COUNTER MEDICATION Take 3,000 mg by mouth daily. Nugenix Ultimate       Allergies:  Allergies  Allergen Reactions  . Penicillins Hives and Other (See Comments)    Mother told him     No family history on file.  Social History:  reports that he has been smoking cigarettes. He has  never used smokeless tobacco. He reports previous alcohol use. He reports previous drug use.  ROS: A complete review of systems was performed.  All systems are negative except for pertinent findings as noted.  Physical Exam:  Vital signs in last 24 hours: There were no vitals taken for this visit. Constitutional:  Alert and oriented, No acute distress Cardiovascular: Regular rate  Respiratory: Normal respiratory effort GI: Abdomen is soft, nontender, nondistended, no abdominal masses. No CVAT. No hernia Genitourinary: Normal male phallus, testes are descended bilaterally and non-tender and without masses, scrotum is normal in appearance without lesions or masses, perineum is normal on inspection. Prostate feels about 20 grams. Lymphatic: No lymphadenopathy Neurologic: Grossly intact, no focal deficits Psychiatric: Normal mood and affect  I have reviewed prior pt  notes  I have reviewed notes from referring/previous physicians  I have reviewed urinalysis results     Impression/Assessment:  ED, organic. PMH + for CAD, DM, tobacco abuse. He does well on Cialis  Plan:  1. Pt advised to stop smoking, focus on cardiovascular health, and to control his diabetes.  2. Pt continued on cialis for E.D.  3. Will contact PCP to ensure continued cialis coverage  4. F/U PRN  CC: Dr. Jeralyn Ruths

## 2020-02-03 ENCOUNTER — Ambulatory Visit: Payer: BC Managed Care – PPO

## 2020-02-04 ENCOUNTER — Ambulatory Visit: Payer: BC Managed Care – PPO

## 2020-03-04 ENCOUNTER — Other Ambulatory Visit (INDEPENDENT_AMBULATORY_CARE_PROVIDER_SITE_OTHER): Payer: Self-pay | Admitting: Nurse Practitioner

## 2020-03-08 ENCOUNTER — Ambulatory Visit: Payer: BC Managed Care – PPO | Admitting: Cardiology

## 2020-03-18 ENCOUNTER — Other Ambulatory Visit (INDEPENDENT_AMBULATORY_CARE_PROVIDER_SITE_OTHER): Payer: Self-pay | Admitting: Nurse Practitioner

## 2020-03-20 MED ORDER — ATORVASTATIN CALCIUM 80 MG PO TABS
80.0000 mg | ORAL_TABLET | Freq: Every day | ORAL | 0 refills | Status: DC
Start: 2020-03-20 — End: 2020-03-30

## 2020-03-21 ENCOUNTER — Ambulatory Visit (INDEPENDENT_AMBULATORY_CARE_PROVIDER_SITE_OTHER): Payer: BC Managed Care – PPO | Admitting: Nurse Practitioner

## 2020-03-27 ENCOUNTER — Ambulatory Visit (INDEPENDENT_AMBULATORY_CARE_PROVIDER_SITE_OTHER): Payer: BC Managed Care – PPO | Admitting: Nurse Practitioner

## 2020-03-29 ENCOUNTER — Encounter (INDEPENDENT_AMBULATORY_CARE_PROVIDER_SITE_OTHER): Payer: Self-pay | Admitting: Nurse Practitioner

## 2020-03-29 ENCOUNTER — Ambulatory Visit: Payer: BC Managed Care – PPO

## 2020-03-30 ENCOUNTER — Other Ambulatory Visit (INDEPENDENT_AMBULATORY_CARE_PROVIDER_SITE_OTHER): Payer: Self-pay | Admitting: Internal Medicine

## 2020-03-30 MED ORDER — ATORVASTATIN CALCIUM 80 MG PO TABS
80.0000 mg | ORAL_TABLET | Freq: Every day | ORAL | 0 refills | Status: DC
Start: 2020-03-30 — End: 2020-04-02

## 2020-04-01 ENCOUNTER — Other Ambulatory Visit (INDEPENDENT_AMBULATORY_CARE_PROVIDER_SITE_OTHER): Payer: Self-pay | Admitting: Nurse Practitioner

## 2020-04-03 ENCOUNTER — Telehealth (INDEPENDENT_AMBULATORY_CARE_PROVIDER_SITE_OTHER): Payer: Self-pay

## 2020-04-03 NOTE — Telephone Encounter (Signed)
Chance sent a fax for a refill on the following:  atorvastatin (LIPITOR) 80 MG tablet   This was sent to the pharmacy electronically yesterday 04/02/2020.

## 2020-04-10 ENCOUNTER — Ambulatory Visit: Payer: BC Managed Care – PPO

## 2020-04-23 ENCOUNTER — Other Ambulatory Visit (INDEPENDENT_AMBULATORY_CARE_PROVIDER_SITE_OTHER): Payer: Self-pay | Admitting: Nurse Practitioner

## 2020-04-24 ENCOUNTER — Other Ambulatory Visit (INDEPENDENT_AMBULATORY_CARE_PROVIDER_SITE_OTHER): Payer: Self-pay | Admitting: Internal Medicine

## 2020-04-24 ENCOUNTER — Other Ambulatory Visit: Payer: Self-pay

## 2020-04-24 ENCOUNTER — Telehealth (INDEPENDENT_AMBULATORY_CARE_PROVIDER_SITE_OTHER): Payer: Self-pay | Admitting: Nurse Practitioner

## 2020-04-24 ENCOUNTER — Encounter (INDEPENDENT_AMBULATORY_CARE_PROVIDER_SITE_OTHER): Payer: Self-pay | Admitting: Nurse Practitioner

## 2020-04-24 ENCOUNTER — Ambulatory Visit (INDEPENDENT_AMBULATORY_CARE_PROVIDER_SITE_OTHER): Payer: BC Managed Care – PPO | Admitting: Nurse Practitioner

## 2020-04-24 VITALS — BP 120/82 | HR 98 | Temp 97.2°F | Ht 69.0 in | Wt 217.8 lb

## 2020-04-24 DIAGNOSIS — Z0000 Encounter for general adult medical examination without abnormal findings: Secondary | ICD-10-CM | POA: Diagnosis not present

## 2020-04-24 DIAGNOSIS — I10 Essential (primary) hypertension: Secondary | ICD-10-CM

## 2020-04-24 DIAGNOSIS — L918 Other hypertrophic disorders of the skin: Secondary | ICD-10-CM

## 2020-04-24 DIAGNOSIS — E1165 Type 2 diabetes mellitus with hyperglycemia: Secondary | ICD-10-CM

## 2020-04-24 MED ORDER — LOSARTAN POTASSIUM-HCTZ 100-12.5 MG PO TABS
1.0000 | ORAL_TABLET | Freq: Every day | ORAL | 0 refills | Status: DC
Start: 2020-04-24 — End: 2020-04-24

## 2020-04-24 MED ORDER — LOSARTAN POTASSIUM-HCTZ 100-12.5 MG PO TABS: 1.0000 | ORAL_TABLET | Freq: Every day | ORAL | 1 refills | Status: DC

## 2020-04-24 NOTE — Telephone Encounter (Addendum)
Gavin Harris, will you attempt to start prior authorization for this patient's Jardiance?  I believe the last office visit that I saw him regarding his Vania Rea was on 01/10/20.  For your information when you start the prior authorization the patient is already on Metformin as well as glipizide and we have had to add Jardiance for further improvement.  I do have paperwork that was sent to me by [facility] regarding this as well.  I will place this on your desk.  Thank you.

## 2020-04-24 NOTE — Addendum Note (Signed)
Addended by: Jeralyn Ruths E on: 04/24/2020 12:03 PM   Modules accepted: Orders

## 2020-04-24 NOTE — Progress Notes (Addendum)
Subjective:  Patient ID: Gavin Harris, male    DOB: 05-20-1966  Age: 54 y.o. MRN: [id number]  CC:  Chief Complaint  Patient presents with  . Follow-up    patient states that he does not have concerns today, had to reschedule some other appointments  . Diabetes  . Hypertension  . Other    Health Maintenance      HPI  This patient arrives today for the above.  Type 2 diabetes: Continues on glipizide, Metformin, and Jardiance.  Due to have CMP and A1c checked today.  Continues on statin and ARB.  Due for eye exam, but tells me he plans on getting this scheduled once his insurance has been reinstated.  He recently changed jobs.  Hypertension: Continues on losartan hydrochlorothiazide is tolerating it well.  Health maintenance: Due for flu, Covid, pneumonia vaccines, and colonoscopy.  He would like to hold off on all vaccinations at this time but is planning on getting colonoscopy scheduled when he sees his gastroenterologist later this month.  Of note, he has a skin tag on his left eyelid as well as around his neck that he would like removed.  He was told in the past by dermatologist that plastic surgery is where he should follow up with for removal of skin tag on his eyelid and was wondering if he would need referral.    Past Medical History:  Diagnosis Date  . CAD (coronary artery disease) 2015   Stent placement  . Diabetes mellitus without complication ([place])   . GERD (gastroesophageal reflux disease)   . Hypercholesteremia   . Hypertension   . Myocardial infarction [facility])       History reviewed. No pertinent family history.  Social History   Social History Narrative  . Not on file   Social History   Tobacco Use  . Smoking status: Heavy Tobacco Smoker    Packs/day: 1.00    Years: 30.00    Pack years: 30.00    Types: Cigarettes  . Smokeless tobacco: Never Used  Substance Use Topics  . Alcohol use: Not Currently     Current Meds  Medication Sig  .  aspirin 81 MG EC tablet Take 81 mg by mouth daily.  Marland Kitchen atorvastatin (LIPITOR) 80 MG tablet Take 1 tablet by mouth once daily  . clopidogrel (PLAVIX) 75 MG tablet Take 1 tablet by mouth once daily  . empagliflozin (JARDIANCE) 10 MG TABS tablet Take 1 tablet (10 mg total) by mouth daily.  Marland Kitchen glipiZIDE (GLUCOTROL) 5 MG tablet Take 1 tablet by mouth once daily  . losartan-hydrochlorothiazide (HYZAAR) 100-12.5 MG tablet Take 1 tablet by mouth daily.  . metFORMIN (GLUCOPHAGE) 1000 MG tablet Take 1 tablet by mouth twice daily  . Multiple Vitamin (MULTI-VITAMIN) tablet Take 1 tablet by mouth daily.  Marland Kitchen OVER THE COUNTER MEDICATION Take 3,000 mg by mouth daily. Nugenix Ultimate  . tadalafil (CIALIS) 20 MG tablet Take 1 tablet (20 mg total) by mouth as needed for erectile dysfunction.  . [DISCONTINUED] losartan-hydrochlorothiazide (HYZAAR) 100-12.5 MG tablet Take 1 tablet by mouth daily.    ROS:  Review of Systems  Constitutional: Negative.  Negative for malaise/fatigue.  Respiratory: Negative.  Negative for shortness of breath.   Cardiovascular: Negative.  Negative for chest pain.  Neurological: Negative for dizziness and headaches.     Objective:   Today's Vitals: BP 120/82   Pulse 98   Temp (!) 97.2 F (36.2 C) (Temporal)   Ht '5\' 9"'  (1.753  m)   Wt 217 lb 12.8 oz (98.8 kg)   SpO2 97%   BMI 32.16 kg/m  Vitals with BMI 04/24/2020 02/01/2020 01/10/2020  Height '5\' 9"'  - '5\' 9"'   Weight 217 lbs 13 oz 218 lbs 218 lbs 10 oz  BMI 27.63 - 94.32  Systolic 003 794 446  Diastolic 82 97 75  Pulse 98 81 68     Physical Exam Vitals reviewed.  Constitutional:      Appearance: Normal appearance.  HENT:     Head: Normocephalic and atraumatic.  Cardiovascular:     Rate and Rhythm: Normal rate and regular rhythm.  Pulmonary:     Effort: Pulmonary effort is normal.     Breath sounds: Normal breath sounds.  Musculoskeletal:     Cervical back: Neck supple.  Skin:    General: Skin is warm and dry.    Neurological:     Mental Status: He is alert and oriented to person, place, and time.  Psychiatric:        Mood and Affect: Mood normal.        Behavior: Behavior normal.        Thought Content: Thought content normal.        Judgment: Judgment normal.          Assessment and Plan   1. Hypertension, unspecified type   2. Type 2 diabetes mellitus with hyperglycemia, without long-term current use of insulin (HCC)   3. Healthcare maintenance   4. Skin tag      Plan: 1.  He will continue on current medication as prescribed.  We will order metabolic panel to be checked today. 2.  He will continue on current medication as prescribed we will check A1c and urine for albuminuria.  He was encouraged to follow-up with eye doctor to have diabetic eye exam. 3.  Encouraged him to consider getting flu, PPSV23, and COVID-19 vaccinations.  He will consider this.  Encouraged him to follow-up with gastroenterology as scheduled to discuss colonoscopy. 4.  We will refer patient to plastic surgery.  Tests ordered Orders Placed This Encounter  Procedures  . CMP with eGFR([facility])  . Hemoglobin A1c  . Microalbumin/Creatinine Ratio, Urine  . Ambulatory referral to Plastic Surgery      Meds ordered this encounter  Medications  . losartan-hydrochlorothiazide (HYZAAR) 100-12.5 MG tablet    Sig: Take 1 tablet by mouth daily.    Dispense:  90 tablet    Refill:  1    Order Specific Question:   Supervising Provider    Answer:   Doree Albee [[id number]]    Patient to follow-up in 3 months or sooner as needed.  Ailene Ards, NP

## 2020-04-24 NOTE — Addendum Note (Signed)
Addended by: Jeralyn Ruths E on: 04/24/2020 01:35 PM   Modules accepted: Orders

## 2020-04-24 NOTE — Telephone Encounter (Signed)
Both insurance cards are not showing in Cover My Meds and I will call the PA number on the back of the most current card to start PA for patient. Called Cover My Meds and spoke to [place] and [place] and neither could assist me with the process of the PA for Jardiance 10mg .

## 2020-05-01 ENCOUNTER — Other Ambulatory Visit: Payer: Self-pay

## 2020-05-01 ENCOUNTER — Ambulatory Visit (INDEPENDENT_AMBULATORY_CARE_PROVIDER_SITE_OTHER): Payer: Self-pay | Admitting: *Deleted

## 2020-05-01 VITALS — Ht 69.0 in | Wt 221.0 lb

## 2020-05-01 DIAGNOSIS — Z1211 Encounter for screening for malignant neoplasm of colon: Secondary | ICD-10-CM

## 2020-05-01 DIAGNOSIS — Z86010 Personal history of colonic polyps: Secondary | ICD-10-CM

## 2020-05-01 MED ORDER — PEG 3350-KCL-NA BICARB-NACL 420 G PO SOLR: 4000.0000 mL | Freq: Once | ORAL | 0 refills | Status: AC

## 2020-05-01 NOTE — Patient Instructions (Signed)
Gavin Harris   July 29, 1965 MRN: [id number]    Procedure Date: 06/05/2020 Time to register: 10:30 am Place to register: [facility] Procedure Time: 12:00 pm Scheduled provider: Dr. Abbey Chatters  PREPARATION FOR COLONOSCOPY WITH TRI-LYTE SPLIT PREP  Please notify us immediately if you are diabetic, take iron supplements, or if you are on Coumadin or any other blood thinners.   Please hold the following medications: See letter  You will need to purchase 1 fleet enema and 1 box of Bisacodyl $RemoveBefo'5mg'DfcxvehCyuZ$  tablets.   2 DAYS BEFORE PROCEDURE:  DATE: 06/03/2020   DAY: Saturday Begin clear liquid diet AFTER your lunch meal. NO SOLID FOODS after this point.  1 DAY BEFORE PROCEDURE:  DATE: 06/04/2020  DAY: Sunday Continue clear liquids the entire day - NO SOLID FOOD.   Diabetic medications adjustments for today: See letter  At 2:00 pm:  Take 2 Bisacodyl tablets.   At 4:00pm:  Start drinking your solution. Make sure you mix well per instructions on the bottle. Try to drink 1 (one) 8 ounce glass every 10-15 minutes until you have consumed HALF the jug. You should complete by 6:00pm.You must keep the left over solution refrigerated until completed next day.  Continue clear liquids. You must drink plenty of clear liquids to prevent dehyration and kidney failure.     DAY OF PROCEDURE:   DATE: 06/05/2020   DAY: Monday If you take medications for your heart, blood pressure or breathing, you may take these medications.  Diabetic medications adjustments for today: See letter  Five hours before your procedure time @ 7:00 am:  Finish remaining amout of bowel prep, drinking 1 (one) 8 ounce glass every 10-15 minutes until complete. You have two hours to consume remaining prep.   Three hours before your procedure time @ 9:00 am:  Nothing by mouth.   At least one hour before going to the hospital:  Give yourself one Fleet enema. You may take your morning medications with sip of water unless we have instructed  otherwise.      Please see below for Dietary Information.  CLEAR LIQUIDS INCLUDE:  Water Jello (NOT red in color)   Ice Popsicles (NOT red in color)   Tea (sugar ok, no milk/cream) Powdered fruit flavored drinks  Coffee (sugar ok, no milk/cream) Gatorade/ Lemonade/ Kool-Aid  (NOT red in color)   Juice: apple, white grape, white cranberry Soft drinks  Clear bullion, consomme, broth (fat free beef/chicken/vegetable)  Carbonated beverages (any kind)  Strained chicken noodle soup Hard Candy   Remember: Clear liquids are liquids that will allow you to see your fingers on the other side of a clear glass. Be sure liquids are NOT red in color, and not cloudy, but CLEAR.  DO NOT EAT OR DRINK ANY OF THE FOLLOWING:  Dairy products of any kind   Cranberry juice Tomato juice / V8 juice   Grapefruit juice Orange juice     Red grape juice  Do not eat any solid foods, including such foods as: cereal, oatmeal, yogurt, fruits, vegetables, creamed soups, eggs, bread, crackers, pureed foods in a blender, etc.   HELPFUL HINTS FOR DRINKING PREP SOLUTION:   Make sure prep is extremely cold. Mix and refrigerate the the morning of the prep. You may also put in the freezer.   You may try mixing some Crystal Light or Country Time Lemonade if you prefer. Mix in small amounts; add more if necessary.  Try drinking through a straw  Rinse mouth with  water or a mouthwash between glasses, to remove after-taste.  Try sipping on a cold beverage /ice/ popsicles between glasses of prep.  Place a piece of sugar-free hard candy in mouth between glasses.  If you become nauseated, try consuming smaller amounts, or stretch out the time between glasses. Stop for 30-60 minutes, then slowly start back drinking.        OTHER INSTRUCTIONS  You will need a responsible adult at least 54 years of age to accompany you and drive you home. This person must remain in the waiting room during your procedure. The hospital  will cancel your procedure if you do not have a responsible adult with you.   1. Wear loose fitting clothing that is easily removed. 2. Leave jewelry and other valuables at home.  3. Remove all body piercing jewelry and leave at home. 4. Total time from sign-in until discharge is approximately 2-3 hours. 5. You should go home directly after your procedure and rest. You can resume normal activities the day after your procedure. 6. The day of your procedure you should not:  Drive  Make legal decisions  Operate machinery  Drink alcohol  Return to work   You may call the office (Dept: [phone]) before 5:00pm, or page the doctor on call [phone]) after 5:00pm, for further instructions, if necessary.   Insurance Information YOU WILL NEED TO CHECK WITH YOUR INSURANCE COMPANY FOR THE BENEFITS OF COVERAGE YOU HAVE FOR THIS PROCEDURE.  UNFORTUNATELY, NOT ALL INSURANCE COMPANIES HAVE BENEFITS TO COVER ALL OR PART OF THESE TYPES OF PROCEDURES.  IT IS YOUR RESPONSIBILITY TO CHECK YOUR BENEFITS, HOWEVER, WE WILL BE GLAD TO ASSIST YOU WITH ANY CODES YOUR INSURANCE COMPANY MAY NEED.    PLEASE NOTE THAT MOST INSURANCE COMPANIES WILL NOT COVER A SCREENING COLONOSCOPY FOR PEOPLE UNDER THE AGE OF 50  IF YOU HAVE BCBS INSURANCE, YOU MAY HAVE BENEFITS FOR A SCREENING COLONOSCOPY BUT IF POLYPS ARE FOUND THE DIAGNOSIS WILL CHANGE AND THEN YOU MAY HAVE A DEDUCTIBLE THAT WILL NEED TO BE MET. SO PLEASE MAKE SURE YOU CHECK YOUR BENEFITS FOR A SCREENING COLONOSCOPY AS WELL AS A DIAGNOSTIC COLONOSCOPY.

## 2020-05-01 NOTE — Progress Notes (Addendum)
Gastroenterology Pre-Procedure Review  Request Date: 05/01/2020 Requesting Physician: Jeralyn Ruths, NP @ [facility], Last TCS 05/11/2015 done by Dr. Cindee Salt at [facility], tubular adenoma, hyperplastic polyp  PATIENT REVIEW QUESTIONS: The patient responded to the following health history questions as indicated:    1. Diabetes Melitis: yes, type II 2. Joint replacements in the past 12 months: no 3. Major health problems in the past 3 months: no 4. Has an artificial valve or MVP: no  5. Has a defibrillator: no 6. Has been advised in past to take antibiotics in advance of a procedure like teeth cleaning: no 7. Family history of colon cancer: no  8. Alcohol Use: no 9. Illicit drug Use: no 10. History of sleep apnea: no  11. History of coronary artery or other vascular stents placed within the last 12 months: no, but 5 years ago 12. History of any prior anesthesia complications: no 13. Body mass index is 32.64 kg/m.    MEDICATIONS & ALLERGIES:    Patient reports the following regarding taking any blood thinners:   Plavix? yes Aspirin? yes Coumadin? no Brilinta? no Xarelto? no Eliquis? no Pradaxa? no Savaysa? no Effient? no  Patient confirms/reports the following medications:  Current Outpatient Medications  Medication Sig Dispense Refill  . aspirin 81 MG EC tablet Take 81 mg by mouth daily.    Marland Kitchen atorvastatin (LIPITOR) 80 MG tablet Take 1 tablet by mouth once daily 90 tablet 0  . clopidogrel (PLAVIX) 75 MG tablet Take 1 tablet by mouth once daily 90 tablet 0  . empagliflozin (JARDIANCE) 10 MG TABS tablet Take 1 tablet (10 mg total) by mouth daily. 90 tablet 1  . glipiZIDE (GLUCOTROL) 5 MG tablet Take 1 tablet by mouth once daily 90 tablet 0  . losartan-hydrochlorothiazide (HYZAAR) 100-12.5 MG tablet Take 1 tablet by mouth daily. 90 tablet 1  . metFORMIN (GLUCOPHAGE) 1000 MG tablet Take 1 tablet by mouth twice daily 180 tablet 0  . Multiple Vitamin  (MULTI-VITAMIN) tablet Take 1 tablet by mouth daily.    Marland Kitchen OVER THE COUNTER MEDICATION Take 3,000 mg by mouth daily. Nugenix Ultimate    . tadalafil (CIALIS) 20 MG tablet Take 1 tablet (20 mg total) by mouth as needed for erectile dysfunction. 20 tablet 11   No current facility-administered medications for this visit.    Patient confirms/reports the following allergies:  Allergies  Allergen Reactions  . Penicillins Hives and Other (See Comments)    Mother told him     No orders of the defined types were placed in this encounter.   AUTHORIZATION INFORMATION Primary Insurance: [place],  [place] #: [id number],  Group #: [id number] Pre-Cert / Auth required: No, file to local BCBS  SCHEDULE INFORMATION: Procedure has been scheduled as follows:  Date: 06/05/2020, Time: 12:00 Location: [facility] with Dr. Abbey Chatters  This Gastroenterology Pre-Precedure Review Form is being routed to the following provider(s): Walden Field, NP

## 2020-05-02 LAB — COMPLETE METABOLIC PANEL WITH GFR
AG Ratio: 1.9 (calc) (ref 1.0–2.5)
ALT: 28 U/L (ref 9–46)
AST: 15 U/L (ref 10–35)
Albumin: 4.6 g/dL (ref 3.6–5.1)
Alkaline phosphatase (APISO): 72 U/L (ref 35–144)
BUN: 10 mg/dL (ref 7–25)
CO2: 28 mmol/L (ref 20–32)
Calcium: 10 mg/dL (ref 8.6–10.3)
Chloride: 105 mmol/L (ref 98–110)
Creat: 0.92 mg/dL (ref 0.70–1.33)
GFR, Est African American: 109 mL/min/{1.73_m2} (ref >=60)
GFR, Est Non African American: 94 mL/min/{1.73_m2} (ref >=60)
Globulin: 2.4 g/dL (calc) (ref 1.9–3.7)
Glucose, Bld: 149 mg/dL — ABNORMAL HIGH (ref 65–99)
Potassium: 4.4 mmol/L (ref 3.5–5.3)
Sodium: 141 mmol/L (ref 135–146)
Total Bilirubin: 0.5 mg/dL (ref 0.2–1.2)
Total Protein: 7 g/dL (ref 6.1–8.1)

## 2020-05-02 LAB — HEMOGLOBIN A1C
Hgb A1c MFr Bld: 7.5 % of total Hgb — ABNORMAL HIGH (ref <5.7)
Mean Plasma Glucose: 169 mg/dL
eAG (mmol/L): 9.3 mmol/L

## 2020-05-02 LAB — MICROALBUMIN / CREATININE URINE RATIO
Creatinine, Urine: 57 mg/dL (ref 20–320)
Microalb Creat Ratio: 11 mcg/mg creat (ref <30)
Microalb, Ur: 0.6 mg/dL

## 2020-05-03 NOTE — Telephone Encounter (Signed)
Called the number on the back of his insurance card and was given the number for Prior Authorization to [employer] at [phone] for the PA dept. Spoke to [place] and the PA is approved from 04/03/2020 to 05/03/2020. Case ID [id number] Approved

## 2020-05-04 ENCOUNTER — Telehealth: Payer: Self-pay | Admitting: *Deleted

## 2020-05-04 NOTE — Progress Notes (Signed)
Called pt and informed him that we needed to cancel his procedure and Covid screening.  He was informed that he needed ov with provider due to Plavix (Dr. Abbey Chatters likes it held 5 days prior and will need clearance).  Pt voiced understanding.  Scheduled ov for 06/16/2020 at 8:30.

## 2020-05-04 NOTE — Progress Notes (Signed)
Will need OV due to Plavix (Dr. Abbey Chatters likes it held 5 days prior and will need clearance)

## 2020-05-04 NOTE — Telephone Encounter (Signed)
Lmom for pt to call us back. 

## 2020-05-28 ENCOUNTER — Other Ambulatory Visit (INDEPENDENT_AMBULATORY_CARE_PROVIDER_SITE_OTHER): Payer: Self-pay | Admitting: Internal Medicine

## 2020-06-02 ENCOUNTER — Other Ambulatory Visit (HOSPITAL_COMMUNITY): Payer: BC Managed Care – PPO

## 2020-06-07 ENCOUNTER — Encounter (INDEPENDENT_AMBULATORY_CARE_PROVIDER_SITE_OTHER): Payer: Self-pay | Admitting: Nurse Practitioner

## 2020-06-12 ENCOUNTER — Ambulatory Visit: Payer: BC Managed Care – PPO | Admitting: Cardiology

## 2020-06-15 NOTE — Progress Notes (Addendum)
Referring Provider: Jeralyn Ruths, NP Primary Care Physician:  Ailene Ards, NP  Primary GI: Dr. Abbey Chatters  Patient Location: Home   Provider Location: [facility] office   Reason for Visit:  Consult colonoscopy   Persons present on the virtual encounter, with roles: Aliene Altes, PA-C (Provider), Gavin Harris (patient)   Total time (minutes) spent on medical discussion: 15 minutes  Virtual Visit via Telephone Note Due to COVID-19, visit is conducted virtually and was requested by patient.   I connected with Gavin Harris on 06/16/20 at  8:30 AM EST by video and verified that I am speaking with the correct person using two identifiers.   I discussed the limitations, risks, security and privacy concerns of performing an evaluation and management service by video and the availability of in person appointments. I also discussed with the patient that there may be a patient responsible charge related to this service. The patient expressed understanding and agreed to proceed.  Chief Complaint  Patient presents with   Colonoscopy   History of Present Illness: Gavin Harris is a 55 y.o. male presenting today at the request of Jeralyn Ruths, NP for consult colonoscopy.  Patient was initially a nurse triage in December 2021; however, due to Plavix, recommended office visit to arrange.  Last colonoscopy 05/11/2015 [facility] with tubular [facility]oma and hyperplastic polyp.  Patient reports he was to repeat his colonoscopy in 5 years.  Denies any GI problems at this time.  No BRBPR, melena, constipation, diarrhea, unintentional weight loss.  Also denies GERD, dysphagia, nausea, vomiting, or abdominal pain.  He is on Plavix due to history of heart attack in 2015 s/p stent placement x1 at [facility].  States he has not seen cardiology in >1 year.  He is establishing with Dr. Harl Bowie on July 21, 2020.  Denies chest pain, heart palpitations, shortness of breath,  cough.  Denies fever, chills, cold or flulike symptoms, lightheadedness, dizziness, presyncope, syncope.  Drives a truck for living.    Past Medical History:  Diagnosis Date   CAD (coronary artery disease) 2015   Stent placement   Diabetes mellitus without complication (HCC)    GERD (gastroesophageal reflux disease)    Hypercholesteremia    Hypertension    Myocardial infarction [facility])      Past Surgical History:  Procedure Laterality Date   COLONOSCOPY  [facility]/11/2015   [facility]; tubular adenoma and hyperplastic polyp.   CORONARY ANGIOPLASTY WITH STENT PLACEMENT  2015   tumor in chest removed     at age 46.      Current Meds  Medication Sig   aspirin 81 MG EC tablet Take 81 mg by mouth daily.   atorvastatin (LIPITOR) 80 MG tablet Take 1 tablet by mouth once daily   clopidogrel (PLAVIX) 75 MG tablet Take 1 tablet by mouth once daily   empagliflozin (JARDIANCE) 10 MG TABS tablet Take 1 tablet (10 mg total) by mouth daily.   glipiZIDE (GLUCOTROL) 5 MG tablet Take 1 tablet by mouth once daily   losartan-hydrochlorothiazide (HYZAAR) 100-12.5 MG tablet Take 1 tablet by mouth daily.   metFORMIN (GLUCOPHAGE) 1000 MG tablet Take 1 tablet by mouth twice daily   Multiple Vitamin (MULTI-VITAMIN) tablet Take 1 tablet by mouth daily.   OVER THE COUNTER MEDICATION Take 3,000 mg by mouth daily. Nugenix Ultimate   tadalafil (CIALIS) 20 MG tablet Take 1 tablet (20 mg total) by mouth as needed for erectile dysfunction.  Family History  Problem Relation Age of Onset   Breast cancer Mother    Lung cancer Father    Colon cancer Neg Hx     Social History   Socioeconomic History   Marital status: Single    Spouse name: Not on file   Number of children: 3   Years of education: Not on file   Highest education level: Not on file  Occupational History   Occupation: truck driver  Tobacco Use   Smoking status: Heavy Tobacco Smoker     Packs/day: 1.00    Years: 30.00    Pack years: 30.00    Types: Cigarettes   Smokeless tobacco: Never Used  Scientific laboratory technician Use: Never used  Substance and Sexual Activity   Alcohol use: Not Currently    Comment: Quit 3 years ago.  (06/16/2020)   Drug use: Not Currently    Types: Marijuana, Cocaine    Comment: 30 years ago.    Sexual activity: Yes  Other Topics Concern   Not on file  Social History Narrative   Not on file   Social Determinants of Health   Financial Resource Strain: Not on file  Food Insecurity: Not on file  Transportation Needs: Not on file  Physical Activity: Not on file  Stress: Not on file  Social Connections: Not on file   Review of Systems: Gen: See HPI CV: See HPI. Resp: See HPI GI: see HPI Derm: Denies rash Psych: Denies depression or anxiety  Heme: See HPI  Observations/Objective: No distress. Alert and oriented. Pleasant. Well nourished. Normal mood and affect. Unable to perform complete physical exam due to video encounter.   Assessment: 55 year old male with history of MI s/p stent placement in 2015 on Plavix and aspirin, HTN, HLD, diabetes presenting today to schedule surveillance colonoscopy.  Last colonoscopy in 2016 with 1 tubular adenoma and 1 hyperplastic polyp.  Currently without any significant lower or upper GI symptoms.  No alarm symptoms.  No family history of colon cancer.   Unfortunately, patient has not been cardiology in 1+ years and plans to establish with Dr. Harl Bowie on 07/21/2020.  As we will need to hold Plavix for 5 days prior to colonoscopy, we will wait to schedule colonoscopy until he has established with cardiology and we received the okay to hold Plavix for 5 days.  Plan: 1.  We will arrange a colonoscopy with propofol with Dr. Abbey Chatters after patient establishes with cardiology in February. The risks, benefits, and alternatives have been discussed with the patient in detail. The patient states understanding and  desires to proceed.   ASA III We will reach out to cardiology to get the okay to hold Plavix for 5 days prior to colonoscopy. See separate instructions for diabetes medication adjustments.  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  Will complete UDS at preop.   Follow Up Instructions: Follow-up as needed.  Advised to call with any new GI concerns.   I discussed the assessment and treatment plan with the patient. The patient was provided an opportunity to ask questions and all were answered. The patient agreed with the plan and demonstrated an understanding of the instructions.   The patient was advised to call back or seek an in-person evaluation if the symptoms worsen or if the condition fails to improve as anticipated.  I provided 15 minutes of video-face-to-face time during this encounter.  Aliene Altes, PA-C [facility] Gastroenterology

## 2020-06-16 ENCOUNTER — Telehealth (INDEPENDENT_AMBULATORY_CARE_PROVIDER_SITE_OTHER): Payer: BC Managed Care – PPO | Admitting: Gastroenterology

## 2020-06-16 ENCOUNTER — Encounter: Payer: Self-pay | Admitting: Gastroenterology

## 2020-06-16 ENCOUNTER — Other Ambulatory Visit: Payer: Self-pay

## 2020-06-16 DIAGNOSIS — Z86010 Personal history of colonic polyps: Secondary | ICD-10-CM | POA: Diagnosis not present

## 2020-06-16 NOTE — Patient Instructions (Addendum)
We will arrange for you to have a colonoscopy in the near future with Dr. Abbey Chatters. We will have to wait to get this arranged until you establish with Dr. Harl Bowie in February. Will reach out to Dr. Harl Bowie to get the okay to hold Plavix for 5 days prior to your colonoscopy.  1 day prior to procedure: Take one half dose of metformin (500 mg in the morning and evening), one half dose of Jardiance (5 mg), and one half dose of glipizide (2.5 mg) Day of your procedure: Do not take any diabetes medications the morning of your procedure.  We will plan to see you back as needed.  Do not hesitate to give [place] a call if you have any new GI concerns.  Aliene Altes, PA-C [facility] Gastroenterology

## 2020-06-21 ENCOUNTER — Telehealth: Payer: Self-pay | Admitting: *Deleted

## 2020-06-21 NOTE — Telephone Encounter (Signed)
Gavin Harris, you are scheduled for a virtual visit with your provider today.  Just as we do with appointments in the office, we must obtain your consent to participate.  Your consent will be active for this visit and any virtual visit you may have with one of our providers in the next 365 days.  If you have a MyChart account, I can also send a copy of this consent to you electronically.  All virtual visits are billed to your insurance company just like a traditional visit in the office.  As this is a virtual visit, video technology does not allow for your provider to perform a traditional examination.  This may limit your provider's ability to fully assess your condition.  If your provider identifies any concerns that need to be evaluated in person or the need to arrange testing such as labs, EKG, etc, we will make arrangements to do so.  Although advances in technology are sophisticated, we cannot ensure that it will always work on either your end or our end.  If the connection with a video visit is poor, we may have to switch to a telephone visit.  With either a video or telephone visit, we are not always able to ensure that we have a secure connection.   I need to obtain your verbal consent now.   Are you willing to proceed with your visit today?

## 2020-06-21 NOTE — Telephone Encounter (Signed)
Pt consented to a virtual visit on 06/16/2020. 

## 2020-06-30 ENCOUNTER — Institutional Professional Consult (permissible substitution): Payer: BC Managed Care – PPO | Admitting: Plastic Surgery

## 2020-07-03 ENCOUNTER — Ambulatory Visit (INDEPENDENT_AMBULATORY_CARE_PROVIDER_SITE_OTHER): Payer: BC Managed Care – PPO | Admitting: Internal Medicine

## 2020-07-12 ENCOUNTER — Other Ambulatory Visit (INDEPENDENT_AMBULATORY_CARE_PROVIDER_SITE_OTHER): Payer: Self-pay | Admitting: Nurse Practitioner

## 2020-07-17 ENCOUNTER — Other Ambulatory Visit (INDEPENDENT_AMBULATORY_CARE_PROVIDER_SITE_OTHER): Payer: Self-pay | Admitting: Internal Medicine

## 2020-07-17 DIAGNOSIS — I10 Essential (primary) hypertension: Secondary | ICD-10-CM

## 2020-07-21 ENCOUNTER — Ambulatory Visit (INDEPENDENT_AMBULATORY_CARE_PROVIDER_SITE_OTHER): Payer: BC Managed Care – PPO | Admitting: Cardiology

## 2020-07-21 ENCOUNTER — Other Ambulatory Visit: Payer: Self-pay

## 2020-07-21 ENCOUNTER — Encounter: Payer: Self-pay | Admitting: Cardiology

## 2020-07-21 VITALS — BP 128/75 | HR 95 | Ht 69.0 in | Wt 216.0 lb

## 2020-07-21 DIAGNOSIS — E782 Mixed hyperlipidemia: Secondary | ICD-10-CM

## 2020-07-21 DIAGNOSIS — I2510 Atherosclerotic heart disease of native coronary artery without angina pectoris: Secondary | ICD-10-CM

## 2020-07-21 DIAGNOSIS — I10 Essential (primary) hypertension: Secondary | ICD-10-CM | POA: Diagnosis not present

## 2020-07-21 NOTE — Progress Notes (Signed)
Clinical Summary Gavin Harris is a 55 y.o.male seen today as a new consult, referred by NP Pearline Cables for the following medical problems.  Previously followed by [facility] cardiology   1. CAD - history of STEMI in 2016, received DES to RCA. He had moderate nonobstructive disease in the LAD. He had mildly reduced LV function with an EF of 45-50% by echo in September 2016.  12/2018 neg stress echo  - no recent chest pain. No SOB or DOE - compliant with meds. He is not sure if he is still getting clopidogrel   2. HTN - compliant with meds   3. Hyperlipidemia 10/2019 TC 11 HDL 32 TG 157 LDL 56   4. DM2 - last A1c 9.1 - followed by pcp   SH: works as Administrator  Past Medical History:  Diagnosis Date  . CAD (coronary artery disease) 2015   Stent placement  . Diabetes mellitus without complication ([place])   . GERD (gastroesophageal reflux disease)   . Hypercholesteremia   . Hypertension   . Myocardial infarction (HCC)      Allergies  Allergen Reactions  . Penicillins Hives and Other (See Comments)    Mother told him      Current Outpatient Medications  Medication Sig Dispense Refill  . aspirin 81 MG EC tablet Take 81 mg by mouth daily.    Marland Kitchen atorvastatin (LIPITOR) 80 MG tablet Take 1 tablet by mouth once daily 90 tablet 0  . clopidogrel (PLAVIX) 75 MG tablet Take 1 tablet by mouth once daily 90 tablet 0  . empagliflozin (JARDIANCE) 10 MG TABS tablet Take 1 tablet (10 mg total) by mouth daily. 90 tablet 1  . glipiZIDE (GLUCOTROL) 5 MG tablet Take 1 tablet by mouth once daily 90 tablet 0  . losartan-hydrochlorothiazide (HYZAAR) 100-12.5 MG tablet Take 1 tablet by mouth once daily 90 tablet 0  . metFORMIN (GLUCOPHAGE) 1000 MG tablet Take 1 tablet by mouth twice daily 180 tablet 0  . Multiple Vitamin (MULTI-VITAMIN) tablet Take 1 tablet by mouth daily.    Marland Kitchen OVER THE COUNTER MEDICATION Take 3,000 mg by mouth daily. Nugenix Ultimate    . tadalafil (CIALIS) 20 MG tablet  Take 1 tablet (20 mg total) by mouth as needed for erectile dysfunction. 20 tablet 11   No current facility-administered medications for this visit.     Past Surgical History:  Procedure Laterality Date  . COLONOSCOPY  05/11/2015   [facility]estive health specialist; tubular adenoma and hyperplastic polyp.  . CORONARY ANGIOPLASTY WITH STENT PLACEMENT  2015  . tumor in chest removed     at age 51.      Allergies  Allergen Reactions  . Penicillins Hives and Other (See Comments)    Mother told him       Family History  Problem Relation Age of Onset  . Breast cancer Mother   . Lung cancer Father   . Colon cancer Neg Hx      Social History Gavin Harris reports that he has been smoking cigarettes. He has a 30.00 pack-year smoking history. He has never used smokeless tobacco. Gavin Harris reports previous alcohol use.   Review of Systems CONSTITUTIONAL: No weight loss, fever, chills, weakness or fatigue.  HEENT: Eyes: No visual loss, blurred vision, double vision or yellow sclerae.No hearing loss, sneezing, congestion, runny nose or sore throat.  SKIN: No rash or itching.  CARDIOVASCULAR: per hpi RESPIRATORY: No shortness of breath, cough or sputum.  GASTROINTESTINAL: No  anorexia, nausea, vomiting or diarrhea. No abdominal pain or blood.  GENITOURINARY: No burning on urination, no polyuria NEUROLOGICAL: No headache, dizziness, syncope, paralysis, ataxia, numbness or tingling in the extremities. No change in bowel or bladder control.  MUSCULOSKELETAL: No muscle, back pain, joint pain or stiffness.  LYMPHATICS: No enlarged nodes. No history of splenectomy.  PSYCHIATRIC: No history of depression or anxiety.  ENDOCRINOLOGIC: No reports of sweating, cold or heat intolerance. No polyuria or polydipsia.  Marland Kitchen   Physical Examination Today's Vitals   07/21/20 0835  BP: 128/75  Pulse: 95  SpO2: 97%  Weight: 216 lb (98 kg)  Height: 5\' 9"  (1.753 m)   Body mass index is 31.9 kg/m.  Gen:  resting comfortably, no acute distress HEENT: no scleral icterus, pupils equal round and reactive, no palptable cervical adenopathy,  CV: RRR, no m/r/g, no jvd Resp: Clear to auscultation bilaterally GI: abdomen is soft, non-tender, non-distended, normal bowel sounds, no hepatosplenomegaly MSK: extremities are warm, no edema.  Skin: warm, no rash Neuro:  no focal deficits Psych: appropriate affect   Diagnostic Studies 12/2018 stress [facility] SUMMARY The patient had no chest pain. The patient achieved 89 % of maximum predicted heart rate. The METS achieved was 8. Exercise capacity was average. LV function mildly decreased at rest: 45%. There is inferior wall and inferolateral wall hypokinesis No augmentation of this wall with stress Negative stress ECG for inducible ischemia at target heart rate. No new wall motion with stress The stress is negative for ischemia and shows prior RCA territory infarction - FINDINGS: - ECG REST The baseline ECG displays normal sinus rhythm. RBBB. Baseline ECG showed  inferior myocardial infarction. - ECG STRESS No diagnostic ST segment changes were seen. Negative stress ECG for inducible  ischemia at target heart rate. ECG changes, blood pressure and heart rate  responses during stress test are shown in the Cardiology Scan portion of this  report in the EPIC. - REST ECHO LV function mildly decreased at rest 45%. There is inferior wall and inferolateral wall hypokinesis No augmentation of this wall with stress. - STRESS ECHO There were no segmental wall motion abnormalities post exercise. The  estimated LV ejection fraction is 55-60% with stress. Negative exercise  echocardiography for inducible ischemia at target heart rate.  01/2015 Echo [place]  The left ventricular size is normal.   Left ventricular systolic function is mildly reduced.  LV ejection fraction = 45-50%.   Regional wall motion abnormalities as specified  below.  The right ventricle is normal in size and function.  The left atrial size is normal.  Structurally normal aortic valve.  There is no aortic regurgitation.  The mitral valve leaflets appear normal.  There is trace mitral regurgitation.  Structurally normal tricuspid valve.  There is trace tricuspid regurgitation.  Structurally normal pulmonic valve.  There is no pulmonic valvular regurgitation.  There is no pericardial effusion.  There is no comparison study available.    01/2015 [facility] [place] underwent successful primary PCI of RCA. LAD has several  50-60% proximal to mid stenoses. LCX has diffuse disease and small. RCA is a  large vessel with 100% thrombotic occlusion. RCA lesion was stented with a  3.5X32mm Premier followed by post-dilation with 4.47mm [place] balloon. There is 0%  residual stenosis and TIMI III flow. Thrombectomy with Export catheter was  performed. Intra-coronary ReoPro was used. Patient will be on IV ReoPro for  12 hrs. There is no procedural complication. Loss of  blood is minimal. 11     Assessment and Plan  1. CAD - no recent symptoms - plavix is on his med list, was not on his last [facility] cardiology visit note in 2020. Last stent was in 2016. He is not sure if he is taking or not. Will clarify with his pharmacy, I do not see an indication to continue. We will d/c plavix - EKG today shows SR, RBBB  2. HTN - manual recueck 128/75 at goal, continue current meds  3. Hyperlipidemia - LDL at goal, continue statin. Discussed dietary changes to improve TGs        Arnoldo Lenis, M.D.

## 2020-07-21 NOTE — Patient Instructions (Addendum)
Your physician wants you to follow-up in: [place] has recommended you make the following change in your medication:   STOP PLAVIX    Thank you for choosing [facility]!!

## 2020-07-24 ENCOUNTER — Telehealth: Payer: Self-pay

## 2020-07-24 NOTE — Telephone Encounter (Signed)
Dear Dr. Harl Bowie  We see a mutual patient by the name of Gavin Harris (DOB 11-08-1965). This pt is going to be scheduled to have a Colonoscopy w/ propofol with Dr. Abbey Chatters. Is it ok for this patient to hold off on taking his Plavix 5 days prior to the procedure. Please advise.       Thank you, Floria Raveling, CMA

## 2020-07-26 ENCOUNTER — Ambulatory Visit (INDEPENDENT_AMBULATORY_CARE_PROVIDER_SITE_OTHER): Payer: BC Managed Care – PPO | Admitting: Nurse Practitioner

## 2020-07-26 ENCOUNTER — Encounter (INDEPENDENT_AMBULATORY_CARE_PROVIDER_SITE_OTHER): Payer: Self-pay | Admitting: Nurse Practitioner

## 2020-07-26 ENCOUNTER — Other Ambulatory Visit: Payer: Self-pay

## 2020-07-26 ENCOUNTER — Telehealth: Payer: Self-pay

## 2020-07-26 VITALS — BP 108/74 | HR 94 | Temp 97.3°F | Ht 69.0 in | Wt 216.6 lb

## 2020-07-26 DIAGNOSIS — I2510 Atherosclerotic heart disease of native coronary artery without angina pectoris: Secondary | ICD-10-CM

## 2020-07-26 DIAGNOSIS — I10 Essential (primary) hypertension: Secondary | ICD-10-CM

## 2020-07-26 DIAGNOSIS — E1165 Type 2 diabetes mellitus with hyperglycemia: Secondary | ICD-10-CM

## 2020-07-26 DIAGNOSIS — M25511 Pain in right shoulder: Secondary | ICD-10-CM | POA: Diagnosis not present

## 2020-07-26 MED ORDER — PEG 3350-KCL-NA BICARB-NACL 420 G PO SOLR: 4000.0000 mL | ORAL | 0 refills | Status: DC

## 2020-07-26 NOTE — Telephone Encounter (Signed)
Note put in chart regarding pt stopping Plavix per Dr. Harl Bowie

## 2020-07-26 NOTE — Telephone Encounter (Signed)
Pt has stopped his Plavix and authorization routed to [place] to schedule colonoscopy with Dr. Abbey Chatters

## 2020-07-26 NOTE — Telephone Encounter (Signed)
Yes, we have actually stopped his plavix all together just a few days ago  Zandra Abts MD

## 2020-07-26 NOTE — Telephone Encounter (Signed)
Spoke to pt, TCS scheduled for 08/29/20--AM (diabetic). Informed him to hold Plavix for 5 days prior to procedure. Orders entered.

## 2020-07-26 NOTE — Telephone Encounter (Signed)
Called pt, LMOVM to call back to schedule TCS w/ propofol, Dr. Abbey Chatters, ASA 3

## 2020-07-26 NOTE — Progress Notes (Signed)
Subjective:  Patient ID: Gavin Harris, male    DOB: Jul 22, 1965  Age: 55 y.o. MRN: [id number]  CC:  Chief Complaint  Patient presents with  . Follow-up    Doing okay, did fall on right shoulder  . Diabetes  . Coronary Artery Disease  . Hypertension      HPI  This patient arrives today for the above.  Right shoulder pain: A little over a month ago he did fall on some ice on his right shoulder.  He was experiencing pain in that shoulder especially when sleeping.  However approximately 10 days ago the pain subsided and he has not had any additional concerns with the shoulder.  Diabetes: Last A1c was 7.5.  He does not routinely check his blood sugars, but sometimes will check a fasting blood sugar and tells me it is normally in the 120s.  He continues on glipizide, Metformin, and Jardiance.  He is tolerating all his medications well.  He is on a statin and ARB.  Was tested for albuminuria couple months ago and this was negative.  Coronary artery disease/hypertension: Follows with cardiology.  Continues on aspirin, atorvastatin, clopidogrel, and losartan/hydrochlorthiazide.  He is tolerating his medications well.  Of note, he is scheduled to undergo a screening colonoscopy next month.  Past Medical History:  Diagnosis Date  . CAD (coronary artery disease) 2015   Stent placement  . Diabetes mellitus without complication ([place])   . GERD (gastroesophageal reflux disease)   . Hypercholesteremia   . Hypertension   . Myocardial infarction [facility])       Family History  Problem Relation Age of Onset  . Breast cancer Mother   . Lung cancer Father   . Colon cancer Neg Hx     Social History   Social History Narrative  . Not on file   Social History   Tobacco Use  . Smoking status: Heavy Tobacco Smoker    Packs/day: 1.00    Years: 30.00    Pack years: 30.00    Types: Cigarettes  . Smokeless tobacco: Never Used  Substance Use Topics  . Alcohol use: Not Currently     Comment: Quit 3 years ago.  (06/16/2020)     Current Meds  Medication Sig  . aspirin 81 MG EC tablet Take 81 mg by mouth daily.  Marland Kitchen atorvastatin (LIPITOR) 80 MG tablet Take 1 tablet by mouth once daily  . empagliflozin (JARDIANCE) 10 MG TABS tablet Take 1 tablet (10 mg total) by mouth daily.  Marland Kitchen glipiZIDE (GLUCOTROL) 5 MG tablet Take 1 tablet by mouth once daily  . losartan-hydrochlorothiazide (HYZAAR) 100-12.5 MG tablet Take 1 tablet by mouth once daily  . metFORMIN (GLUCOPHAGE) 1000 MG tablet Take 1 tablet by mouth twice daily  . Multiple Vitamin (MULTI-VITAMIN) tablet Take 1 tablet by mouth daily.  Marland Kitchen OVER THE COUNTER MEDICATION Take 3,000 mg by mouth daily. Nugenix Ultimate  . polyethylene glycol-electrolytes (TRILYTE) 420 g solution Take 4,000 mLs by mouth as directed.  . tadalafil (CIALIS) 20 MG tablet Take 1 tablet (20 mg total) by mouth as needed for erectile dysfunction.    ROS:  Review of Systems  Eyes: Negative for blurred vision.  Respiratory: Negative for shortness of breath.   Cardiovascular: Negative for chest pain.  Neurological: Negative for dizziness and headaches.     Objective:   Today's Vitals: BP 108/74   Pulse 94   Temp (!) 97.3 F (36.3 C) (Temporal)   Ht 5\' 9"  (1.753  m)   Wt 216 lb 9.6 oz (98.2 kg)   SpO2 98%   BMI 31.99 kg/m  Vitals with BMI 07/26/2020 07/26/2020 07/21/2020  Height - 5\' 9"  5\' 9"   Weight - 216 lbs 10 oz 216 lbs  BMI - 26.94 85.46  Systolic 270 94 350  Diastolic 74 62 75  Pulse - 94 95     Physical Exam Vitals reviewed.  Constitutional:      Appearance: Normal appearance.  HENT:     Head: Normocephalic and atraumatic.  Cardiovascular:     Rate and Rhythm: Normal rate and regular rhythm.  Pulmonary:     Effort: Pulmonary effort is normal.     Breath sounds: Normal breath sounds.  Musculoskeletal:     Cervical back: Neck supple.  Skin:    General: Skin is warm and dry.  Neurological:     Mental Status: He is alert and  oriented to person, place, and time.  Psychiatric:        Mood and Affect: Mood normal.        Behavior: Behavior normal.        Thought Content: Thought content normal.        Judgment: Judgment normal.          Assessment and Plan   1. Acute pain of right shoulder   2. Type 2 diabetes mellitus with hyperglycemia, without long-term current use of insulin (HCC)   3. Coronary artery disease involving native heart without angina pectoris, unspecified vessel or lesion type   4. Hypertension, unspecified type      Plan: 1.  This seems to have resolved.  No further work-up or recommendations made today.  He was told if symptoms return or worsen to let us know. 2.  He will continue on his current medications as prescribed.  It is a bit too early check A1c today, so we will plan on doing this at next office visit. 3.,  4.  He will continue taking his medications as prescribed.  Blood pressure was a little bit low on initial check but did improve slightly on recheck.  Patient feels fine, he was encouraged to make sure he is hydrating regularly throughout the day.  Tells me he understands.   Tests ordered No orders of the defined types were placed in this encounter.     No orders of the defined types were placed in this encounter.   Patient to follow-up in 3 months or sooner as needed.  Ailene Ards, NP'

## 2020-07-27 NOTE — Telephone Encounter (Signed)
Pre-op/covid test 4/1/222 at 2:00pm. Appt letter mailed with procedure instructions.

## 2020-08-04 ENCOUNTER — Institutional Professional Consult (permissible substitution): Payer: BC Managed Care – PPO | Admitting: Plastic Surgery

## 2020-08-13 ENCOUNTER — Other Ambulatory Visit (INDEPENDENT_AMBULATORY_CARE_PROVIDER_SITE_OTHER): Payer: Self-pay | Admitting: Nurse Practitioner

## 2020-08-13 DIAGNOSIS — Z1159 Encounter for screening for other viral diseases: Secondary | ICD-10-CM

## 2020-08-13 DIAGNOSIS — E1165 Type 2 diabetes mellitus with hyperglycemia: Secondary | ICD-10-CM

## 2020-08-24 NOTE — Patient Instructions (Addendum)
Your procedure is scheduled on: 08/29/2020  Report to [facility] at  7:45   AM.  Call this number if you have problems the morning of surgery: [phone]   Remember:              Follow Directions on the letter you received from Your Physician's office regarding the Bowel Prep              No Smoking the day of Procedure :   Take these medicines the morning of surgery with A SIP OF WATER: none              No diabetic medication morning of procedure             Hold Plavix 5 days as instructed by office               Do not wear jewelry, make-up or nail polish.    Do not bring valuables to the hospital.  Contacts, dentures or bridgework may not be worn into surgery.  .   Patients discharged the day of surgery will not be allowed to drive home.     Colonoscopy, Adult, Care After This sheet gives you information about how to care for yourself after your procedure. Your health care provider may also give you more specific instructions. If you have problems or questions, contact your health care provider. What can I expect after the procedure? After the procedure, it is common to have:  A small amount of blood in your stool for 24 hours after the procedure.  Some gas.  Mild abdominal cramping or bloating.  Follow these instructions at home: General instructions   For the first 24 hours after the procedure: ? Do not drive or use machinery. ? Do not sign important documents. ? Do not drink alcohol. ? Do your regular daily activities at a slower pace than normal. ? Eat soft, easy-to-digest foods. ? Rest often.  Take over-the-counter or prescription medicines only as told by your health care provider.  It is up to you to get the results of your procedure. Ask your health care provider, or the department performing the procedure, when your results will be ready. Relieving cramping and bloating  Try walking around when you have cramps or feel bloated.  Apply heat to  your abdomen as told by your health care provider. Use a heat source that your health care provider recommends, such as a moist heat pack or a heating pad. ? Place a towel between your skin and the heat source. ? Leave the heat on for 20-30 minutes. ? Remove the heat if your skin turns bright red. This is especially important if you are unable to feel pain, heat, or cold. You may have a greater risk of getting burned. Eating and drinking  Drink enough fluid to keep your urine clear or pale yellow.  Resume your normal diet as instructed by your health care provider. Avoid heavy or fried foods that are hard to digest.  Avoid drinking alcohol for as long as instructed by your health care provider. Contact a health care provider if:  You have blood in your stool 2-3 days after the procedure. Get help right away if:  You have more than a small spotting of blood in your stool.  You pass large blood clots in your stool.  Your abdomen is swollen.  You have nausea or vomiting.  You have a fever.  You have increasing abdominal pain that is  not relieved with medicine. This information is not intended to replace advice given to you by your health care provider. Make sure you discuss any questions you have with your health care provider. Document Released: 12/26/2003 Document Revised: 02/05/2016 Document Reviewed: 07/25/2015 Elsevier Interactive Patient Education  [employer].

## 2020-08-25 ENCOUNTER — Encounter (HOSPITAL_COMMUNITY)
Admission: RE | Admit: 2020-08-25 | Discharge: 2020-08-25 | Disposition: A | Discharge status: Home / Self Care | Payer: BC Managed Care – PPO | Source: Ambulatory Visit | Attending: Internal Medicine | Admitting: Internal Medicine

## 2020-08-25 ENCOUNTER — Other Ambulatory Visit: Payer: Self-pay

## 2020-08-25 ENCOUNTER — Other Ambulatory Visit (HOSPITAL_COMMUNITY)
Admission: RE | Admit: 2020-08-25 | Discharge: 2020-08-25 | Disposition: A | Discharge status: Home / Self Care | Payer: BC Managed Care – PPO | Source: Ambulatory Visit | Attending: Internal Medicine | Admitting: Internal Medicine

## 2020-08-25 ENCOUNTER — Encounter (HOSPITAL_COMMUNITY): Payer: Self-pay

## 2020-08-25 DIAGNOSIS — Z01812 Encounter for preprocedural laboratory examination: Secondary | ICD-10-CM | POA: Diagnosis present

## 2020-08-25 DIAGNOSIS — Z20822 Contact with and (suspected) exposure to covid-19: Secondary | ICD-10-CM | POA: Diagnosis not present

## 2020-08-25 LAB — BASIC METABOLIC PANEL
Anion gap: 11 (ref 5–15)
BUN: 13 mg/dL (ref 6–20)
CO2: 23 mmol/L (ref 22–32)
Calcium: 9.2 mg/dL (ref 8.9–10.3)
Chloride: 104 mmol/L (ref 98–111)
Creatinine, Ser: 0.96 mg/dL (ref 0.61–1.24)
GFR, Estimated: >60 mL/min (ref >60)
Glucose, Bld: 150 mg/dL — ABNORMAL HIGH (ref 70–99)
Potassium: 3.2 mmol/L — ABNORMAL LOW (ref 3.5–5.1)
Sodium: 138 mmol/L (ref 135–145)

## 2020-08-25 LAB — RAPID URINE DRUG SCREEN, HOSP PERFORMED
Amphetamines: NOT DETECTED
Barbiturates: NOT DETECTED
Benzodiazepines: NOT DETECTED
Cocaine: NOT DETECTED
Opiates: NOT DETECTED
Tetrahydrocannabinol: NOT DETECTED

## 2020-08-26 LAB — SARS CORONAVIRUS 2 (TAT 6-24 HRS): SARS Coronavirus 2: NEGATIVE

## 2020-08-29 ENCOUNTER — Ambulatory Visit (HOSPITAL_COMMUNITY)
Admission: RE | Admit: 2020-08-29 | Discharge: 2020-08-29 | Disposition: A | Discharge status: Home / Self Care | Payer: BC Managed Care – PPO | Attending: Internal Medicine | Admitting: Internal Medicine

## 2020-08-29 ENCOUNTER — Ambulatory Visit (HOSPITAL_COMMUNITY): Payer: BC Managed Care – PPO | Admitting: Anesthesiology

## 2020-08-29 ENCOUNTER — Encounter (HOSPITAL_COMMUNITY)
Admission: RE | Disposition: A | Discharge status: Home / Self Care | Payer: Self-pay | Source: Home / Self Care | Attending: Internal Medicine

## 2020-08-29 ENCOUNTER — Encounter (HOSPITAL_COMMUNITY): Payer: Self-pay

## 2020-08-29 DIAGNOSIS — F17210 Nicotine dependence, cigarettes, uncomplicated: Secondary | ICD-10-CM | POA: Insufficient documentation

## 2020-08-29 DIAGNOSIS — Z7982 Long term (current) use of aspirin: Secondary | ICD-10-CM | POA: Diagnosis not present

## 2020-08-29 DIAGNOSIS — Z955 Presence of coronary angioplasty implant and graft: Secondary | ICD-10-CM | POA: Insufficient documentation

## 2020-08-29 DIAGNOSIS — Z1211 Encounter for screening for malignant neoplasm of colon: Secondary | ICD-10-CM | POA: Insufficient documentation

## 2020-08-29 DIAGNOSIS — I10 Essential (primary) hypertension: Secondary | ICD-10-CM | POA: Diagnosis not present

## 2020-08-29 DIAGNOSIS — Z880 Allergy status to penicillin: Secondary | ICD-10-CM | POA: Diagnosis not present

## 2020-08-29 DIAGNOSIS — D123 Benign neoplasm of transverse colon: Secondary | ICD-10-CM | POA: Diagnosis not present

## 2020-08-29 DIAGNOSIS — K648 Other hemorrhoids: Secondary | ICD-10-CM | POA: Insufficient documentation

## 2020-08-29 DIAGNOSIS — Z803 Family history of malignant neoplasm of breast: Secondary | ICD-10-CM | POA: Diagnosis not present

## 2020-08-29 DIAGNOSIS — Z801 Family history of malignant neoplasm of trachea, bronchus and lung: Secondary | ICD-10-CM | POA: Insufficient documentation

## 2020-08-29 DIAGNOSIS — Z7984 Long term (current) use of oral hypoglycemic drugs: Secondary | ICD-10-CM | POA: Diagnosis not present

## 2020-08-29 DIAGNOSIS — K219 Gastro-esophageal reflux disease without esophagitis: Secondary | ICD-10-CM | POA: Diagnosis not present

## 2020-08-29 DIAGNOSIS — E119 Type 2 diabetes mellitus without complications: Secondary | ICD-10-CM | POA: Insufficient documentation

## 2020-08-29 HISTORY — PX: POLYPECTOMY: SHX5525

## 2020-08-29 HISTORY — PX: COLONOSCOPY WITH PROPOFOL: SHX5780

## 2020-08-29 LAB — GLUCOSE, CAPILLARY: Glucose-Capillary: 179 mg/dL — ABNORMAL HIGH (ref 70–99)

## 2020-08-29 SURGERY — COLONOSCOPY WITH PROPOFOL
Anesthesia: General

## 2020-08-29 MED ORDER — PROPOFOL 500 MG/50ML IV EMUL
INTRAVENOUS | Status: DC | PRN
  Administered 2020-08-29: 125 ug/kg/min via INTRAVENOUS

## 2020-08-29 MED ORDER — STERILE WATER FOR IRRIGATION IR SOLN
Status: DC | PRN
  Administered 2020-08-29: 200 mL

## 2020-08-29 MED ORDER — LIDOCAINE HCL (CARDIAC) PF 50 MG/5ML IV SOSY
PREFILLED_SYRINGE | INTRAVENOUS | Status: DC | PRN
  Administered 2020-08-29: 50 mg via INTRAVENOUS

## 2020-08-29 MED ORDER — PROPOFOL 10 MG/ML IV BOLUS
INTRAVENOUS | Status: DC | PRN
  Administered 2020-08-29: 100 mg via INTRAVENOUS
  Administered 2020-08-29: 50 mg via INTRAVENOUS

## 2020-08-29 MED ORDER — LACTATED RINGERS IV SOLN
INTRAVENOUS | Status: DC
  Administered 2020-08-29: 1000 mL via INTRAVENOUS

## 2020-08-29 NOTE — Anesthesia Postprocedure Evaluation (Signed)
Anesthesia Post Note  Patient: Gavin Harris  Procedure(s) Performed: COLONOSCOPY WITH PROPOFOL (N/A ) POLYPECTOMY  Patient location during evaluation: Phase II Anesthesia Type: General Level of consciousness: awake and alert and patient cooperative Pain management: satisfactory to patient Vital Signs Assessment: post-procedure vital signs reviewed and stable Respiratory status: spontaneous breathing Cardiovascular status: stable Postop Assessment: no apparent nausea or vomiting Anesthetic complications: no   No complications documented.   Last Vitals:  Vitals:   08/29/20 0801 08/29/20 0914  BP: 129/88 111/67  Pulse: 81 74  Resp: 19 15  Temp: 37.2 C 36.5 C  SpO2: 96% 95%    Last Pain:  Vitals:   08/29/20 0914  TempSrc: Oral  PainSc: 0-No pain                 Dabney Schanz

## 2020-08-29 NOTE — Anesthesia Procedure Notes (Signed)
Date/Time: 08/29/2020 8:45 AM Performed by: Vista Deck, CRNA Pre-anesthesia Checklist: Patient identified, Emergency Drugs available, Suction available, Timeout performed and Patient being monitored Patient Re-evaluated:Patient Re-evaluated prior to induction Oxygen Delivery Method: Nasal Cannula

## 2020-08-29 NOTE — Anesthesia Preprocedure Evaluation (Signed)
Anesthesia Evaluation  Patient identified by MRN, date of birth, ID band Patient awake    Reviewed: Allergy & Precautions, H&P , NPO status , Patient's Chart, lab work & pertinent test results, reviewed documented beta blocker date and time   Airway Mallampati: II  TM Distance: >3 FB Neck ROM: full    Dental no notable dental hx.    Pulmonary neg pulmonary ROS, Current Smoker and Patient abstained from smoking.,    Pulmonary exam normal breath sounds clear to auscultation       Cardiovascular Exercise Tolerance: Good hypertension, + CAD, + Past MI and + Cardiac Stents   Rhythm:regular Rate:Normal     Neuro/Psych negative neurological ROS  negative psych ROS   GI/Hepatic Neg liver ROS, GERD  Medicated,  Endo/Other  negative endocrine ROSdiabetes  Renal/GU negative Renal ROS  negative genitourinary   Musculoskeletal   Abdominal   Peds  Hematology negative hematology ROS (+)   Anesthesia Other Findings   Reproductive/Obstetrics negative OB ROS                             Anesthesia Physical Anesthesia Plan  ASA: III  Anesthesia Plan: General   Post-op Pain Management:    Induction:   PONV Risk Score and Plan: Propofol infusion  Airway Management Planned:   Additional Equipment:   Intra-op Plan:   Post-operative Plan:   Informed Consent: I have reviewed the patients History and Physical, chart, labs and discussed the procedure including the risks, benefits and alternatives for the proposed anesthesia with the patient or authorized representative who has indicated his/her understanding and acceptance.     Dental Advisory Given  Plan Discussed with: CRNA  Anesthesia Plan Comments:         Anesthesia Quick Evaluation

## 2020-08-29 NOTE — Transfer of Care (Signed)
Immediate Anesthesia Transfer of Care Note  Patient: Gavin Harris  Procedure(s) Performed: COLONOSCOPY WITH PROPOFOL (N/A ) POLYPECTOMY  Patient Location: Short Stay  Anesthesia Type:General  Level of Consciousness: awake and patient cooperative  Airway & Oxygen Therapy: Patient Spontanous Breathing  Post-op Assessment: Report given to RN and Post -op Vital signs reviewed and stable  Post vital signs: Reviewed and stable  Last Vitals:  Vitals Value Taken Time  BP    Temp    Pulse    Resp    SpO2     SEE PACU FLOW SHEET Last Pain:  Vitals:   08/29/20 0846  TempSrc:   PainSc: 0-No pain      Patients Stated Pain Goal: 8 (63/14/97 0263)  Complications: No complications documented.

## 2020-08-29 NOTE — H&P (Signed)
Primary Care Physician:  Ailene Ards, NP Primary Gastroenterologist:  Dr. Abbey Chatters  Pre-Procedure History & Physical: HPI:  Gavin Harris is a 55 y.o. male is here for a colonoscopy for surveillance due to personal history of adenomatous colon polyps. Last colonoscopy 05/11/2015 by Dr. Cindee [facility] with tubular adenoma and hyperplastic polyp.  Patient reports he was to repeat his colonoscopy in 5 years.  Denies any GI problems at this time.  No BRBPR, melena, constipation, diarrhea, unintentional weight loss.  Also denies GERD, dysphagia, nausea, vomiting, or abdominal pain.  Past Medical History:  Diagnosis Date  . CAD (coronary artery disease) 2015   Stent placement  . Diabetes mellitus without complication ([place])   . GERD (gastroesophageal reflux disease)   . Hypercholesteremia   . Hypertension   . Myocardial infarction [facility]) 2014    Past Surgical History:  Procedure Laterality Date  . COLONOSCOPY  05/11/2015   [facility]; tubular adenoma and hyperplastic polyp.  . CORONARY ANGIOPLASTY WITH STENT PLACEMENT  2015  . tumor in chest removed     at age 35.     Prior to Admission medications   Medication Sig Start Date End Date Taking? Authorizing Provider  aspirin 81 MG EC tablet Take 81 mg by mouth daily. 02/01/15  Yes [provider]  atorvastatin (LIPITOR) 80 MG tablet Take 1 tablet by mouth once daily Patient taking differently: Take 80 mg by mouth daily. 07/13/20  Yes Gosrani, Nimish C, MD  glipiZIDE (GLUCOTROL) 5 MG tablet Take 1 tablet by mouth once daily Patient taking differently: Take 5 mg by mouth daily before breakfast. 05/28/20  Yes Gosrani, Nimish C, MD  JARDIANCE 10 MG TABS tablet Take 1 tablet by mouth once daily Patient taking differently: Take 10 mg by mouth daily. 08/14/20  Yes Ailene Ards, NP  losartan-hydrochlorothiazide (HYZAAR) 100-12.5 MG tablet Take 1 tablet by mouth once daily Patient taking differently: Take 1  tablet by mouth daily. 07/17/20  Yes Doree Albee, MD  metFORMIN (GLUCOPHAGE) 1000 MG tablet Take 1 tablet by mouth twice daily Patient taking differently: Take 1,000 mg by mouth 2 (two) times daily with a meal. 02/01/20  Yes Gosrani, Nimish C, MD  Multiple Vitamin (MULTI-VITAMIN) tablet Take 1 tablet by mouth daily.   Yes [provider]  OVER THE COUNTER MEDICATION Take 3,000 mg by mouth daily. Nugenix Ultimate   Yes [provider]  polyethylene glycol-electrolytes (TRILYTE) 420 g solution Take 4,000 mLs by mouth as directed. Patient not taking: Reported on 08/18/2020 07/26/20   Eloise Harman, DO  tadalafil (CIALIS) 20 MG tablet Take 1 tablet (20 mg total) by mouth as needed for erectile dysfunction. 02/01/20   Franchot Gallo, MD    Allergies as of 07/26/2020 - Review Complete 07/26/2020  Allergen Reaction Noted  . Penicillins Hives and Other (See Comments) 08/27/2012    Family History  Problem Relation Age of Onset  . Breast cancer Mother   . Lung cancer Father   . Colon cancer Neg Hx     Social History   Socioeconomic History  . Marital status: Single    Spouse name: Not on file  . Number of children: 3  . Years of education: Not on file  . Highest education level: Not on file  Occupational History  . Occupation: truck Geophysicist/field seismologist  Tobacco Use  . Smoking status: Heavy Tobacco Smoker    Packs/day: 1.00    Years: 30.00    Pack  years: 30.00    Types: Cigarettes  . Smokeless tobacco: Never Used  Vaping Use  . Vaping Use: Never used  Substance and Sexual Activity  . Alcohol use: Not Currently    Comment: Quit 3 years ago.  (06/16/2020)  . Drug use: Not Currently    Types: Marijuana, Cocaine    Comment: 30 years ago.   Marland Kitchen Sexual activity: Yes  Other Topics Concern  . Not on file  Social History Narrative  . Not on file   Social Determinants of Health   Financial Resource Strain: Not on file  Food Insecurity: Not on file  Transportation Needs:  Not on file  Physical Activity: Not on file  Stress: Not on file  Social Connections: Not on file  Intimate Partner Violence: Not on file    Review of Systems: See HPI, otherwise negative ROS  Physical Exam: Vital signs in last 24 hours: Temp:  [98.9 F (37.2 C)] 98.9 F (37.2 C) (04/05 0801) Pulse Rate:  [81] 81 (04/05 0801) Resp:  [19] 19 (04/05 0801) BP: (129)/(88) 129/88 (04/05 0801) SpO2:  [96 %] 96 % (04/05 0801)   General:   Alert,  Well-developed, well-nourished, pleasant and cooperative in NAD Head:  Normocephalic and atraumatic. Eyes:  Sclera clear, no icterus.   Conjunctiva pink. Ears:  Normal auditory acuity. Nose:  No deformity, discharge,  or lesions. Mouth:  No deformity or lesions, dentition normal. Neck:  Supple; no masses or thyromegaly. Lungs:  Clear throughout to auscultation.   No wheezes, crackles, or rhonchi. No acute distress. Heart:  Regular rate and rhythm; no murmurs, clicks, rubs,  or gallops. Abdomen:  Soft, nontender and nondistended. No masses, hepatosplenomegaly or hernias noted. Normal bowel sounds, without guarding, and without rebound.   Msk:  Symmetrical without gross deformities. Normal posture. Extremities:  Without clubbing or edema. Neurologic:  Alert and  oriented x4;  grossly normal neurologically. Skin:  Intact without significant lesions or rashes. Cervical Nodes:  No significant cervical adenopathy. Psych:  Alert and cooperative. Normal mood and affect.  Impression/Plan: Gavin Harris is here  for a colonoscopy for surveillance due to personal history of adenomatous colon polyps.  The risks of the procedure including infection, bleed, or perforation as well as benefits, limitations, alternatives and imponderables have been reviewed with the patient. Questions have been answered. All parties agreeable.

## 2020-08-29 NOTE — Op Note (Signed)
[facility] Patient Name: Gavin Harris Procedure Date: 08/29/2020 8:44 AM MRN: [id number] Date of Birth: Oct 17, 1965 Attending MD: Elon Alas. Edgar Frisk CSN: [id number] Age: 55 Admit Type: Outpatient Procedure:                Colonoscopy Indications:              High risk colon cancer surveillance: Personal                            history of colonic polyps Providers:                Elon Alas. Abbey Chatters, DO, Crystal Page, Raphael Gibney,                            Technician Referring MD:              Medicines:                See the Anesthesia note for documentation of the                            administered medications Complications:            No immediate complications. Estimated Blood Loss:     Estimated blood loss was minimal. Procedure:                Pre-Anesthesia Assessment:                           - The anesthesia plan was to use monitored                            anesthesia care (MAC).                           After obtaining informed consent, the colonoscope                            was passed under direct vision. Throughout the                            procedure, the patient's blood pressure, pulse, and                            oxygen saturations were monitored continuously. The                            PCF-H190DL ([id number]) scope was introduced through                            the anus and advanced to the the cecum, identified                            by appendiceal orifice and ileocecal valve. The                            colonoscopy was performed without difficulty. The  patient tolerated the procedure well. The quality                            of the bowel preparation was evaluated using the                            BBPS [facility] Bowel Preparation Scale) with scores                            of: Right Colon = 2 (minor amount of residual                            staining, small fragments of stool and/or opaque                             liquid, but mucosa seen well), Transverse Colon = 2                            (minor amount of residual staining, small fragments                            of stool and/or opaque liquid, but mucosa seen                            well) and Left Colon = 2 (minor amount of residual                            staining, small fragments of stool and/or opaque                            liquid, but mucosa seen well). The total BBPS score                            equals 6. The quality of the bowel preparation was                            fair. Scope In: 8:49:56 AM Scope Out: 9:07:51 AM Scope Withdrawal Time: 0 hours 15 minutes 5 seconds  Total Procedure Duration: 0 hours 17 minutes 55 seconds  Findings:      The perianal and digital rectal examinations were normal.      Non-bleeding internal hemorrhoids were found during endoscopy.      A 2 mm polyp was found in the transverse colon. The polyp was sessile.       The polyp was removed with a cold biopsy forceps. Resection and       retrieval were complete.      A moderate amount of stool was found in the transverse colon, in the       ascending colon and in the cecum, making visualization difficult. Lavage       of the area was performed using copious amounts of sterile water,       resulting in clearance with fair visualization. Impression:               -  Preparation of the colon was fair.                           - Non-bleeding internal hemorrhoids.                           - One 2 mm polyp in the transverse colon, removed                            with a cold biopsy forceps. Resected and retrieved.                           - Stool in the transverse colon, in the ascending                            colon and in the cecum. Moderate Sedation:      Per Anesthesia Care Recommendation:           - Patient has a contact number available for                            emergencies. The signs and symptoms of  potential                            delayed complications were discussed with the                            patient. Return to normal activities tomorrow.                            Written discharge instructions were provided to the                            patient.                           - Resume previous diet.                           - Continue present medications.                           - Await pathology results.                           - Repeat colonoscopy in 5 years for surveillance.                           - Return to GI clinic PRN. Procedure Code(s):        --- Professional ---                           [phone], Colonoscopy, flexible; with biopsy, single                            or multiple Diagnosis Code(s):        ---  Professional ---                           Z86.010, Personal history of colonic polyps                           K64.8, Other hemorrhoids                           K63.5, Polyp of colon CPT copyright 2019 American Medical Association. All rights reserved. The codes documented in this report are preliminary and upon coder review may  be revised to meet current compliance requirements. Elon Alas. Abbey Chatters, DO [place] Abbey Chatters, DO 08/29/2020 9:10:59 AM This report has been signed electronically. Number of Addenda: 0

## 2020-08-29 NOTE — Discharge Instructions (Signed)
  Colonoscopy Discharge Instructions  Read the instructions outlined below and refer to this sheet in the next few weeks. These discharge instructions provide you with general information on caring for yourself after you leave the hospital. Your doctor may also give you specific instructions. While your treatment has been planned according to the most current medical practices available, unavoidable complications occasionally occur.   ACTIVITY You may resume your regular activity, but move at a slower pace for the next 24 hours.  Take frequent rest periods for the next 24 hours.  Walking will help get rid of the air and reduce the bloated feeling in your belly (abdomen).  No driving for 24 hours (because of the medicine (anesthesia) used during the test).   Do not sign any important legal documents or operate any machinery for 24 hours (because of the anesthesia used during the test).  NUTRITION Drink plenty of fluids.  You may resume your normal diet as instructed by your doctor.  Begin with a light meal and progress to your normal diet. Heavy or fried foods are harder to digest and may make you feel sick to your stomach (nauseated).  Avoid alcoholic beverages for 24 hours or as instructed.  MEDICATIONS You may resume your normal medications unless your doctor tells you otherwise.  WHAT YOU CAN EXPECT TODAY Some feelings of bloating in the abdomen.  Passage of more gas than usual.  Spotting of blood in your stool or on the toilet paper.  IF YOU HAD POLYPS REMOVED DURING THE COLONOSCOPY: No aspirin products for 7 days or as instructed.  No alcohol for 7 days or as instructed.  Eat a soft diet for the next 24 hours.  FINDING OUT THE RESULTS OF YOUR TEST Not all test results are available during your visit. If your test results are not back during the visit, make an appointment with your caregiver to find out the results. Do not assume everything is normal if you have not heard from your  caregiver or the medical facility. It is important for you to follow up on all of your test results.  SEEK IMMEDIATE MEDICAL ATTENTION IF: You have more than a spotting of blood in your stool.  Your belly is swollen (abdominal distention).  You are nauseated or vomiting.  You have a temperature over 101.  You have abdominal pain or discomfort that is severe or gets worse throughout the day.   Your colonoscopy revealed 1 polyp(s) which I removed successfully. Await pathology results, my office will contact you. I recommend repeating colonoscopy in 5 years for surveillance purposes. Otherwise follow up with GI as needed.    I hope you have a great rest of your week!  Shawnice Tilmon K. Yida Hyams, D.O. Gastroenterology and Hepatology [facility]  

## 2020-09-01 LAB — SURGICAL PATHOLOGY

## 2020-09-04 ENCOUNTER — Encounter (HOSPITAL_COMMUNITY): Payer: Self-pay | Admitting: Internal Medicine

## 2020-09-11 ENCOUNTER — Other Ambulatory Visit (INDEPENDENT_AMBULATORY_CARE_PROVIDER_SITE_OTHER): Payer: Self-pay | Admitting: Internal Medicine

## 2020-10-15 ENCOUNTER — Other Ambulatory Visit (INDEPENDENT_AMBULATORY_CARE_PROVIDER_SITE_OTHER): Payer: Self-pay | Admitting: Internal Medicine

## 2020-10-15 DIAGNOSIS — I10 Essential (primary) hypertension: Secondary | ICD-10-CM

## 2020-10-27 ENCOUNTER — Ambulatory Visit (INDEPENDENT_AMBULATORY_CARE_PROVIDER_SITE_OTHER): Payer: BC Managed Care – PPO | Admitting: Plastic Surgery

## 2020-10-27 ENCOUNTER — Encounter: Payer: Self-pay | Admitting: Plastic Surgery

## 2020-10-27 ENCOUNTER — Other Ambulatory Visit: Payer: Self-pay

## 2020-10-27 DIAGNOSIS — L989 Disorder of the skin and subcutaneous tissue, unspecified: Secondary | ICD-10-CM | POA: Diagnosis not present

## 2020-10-27 NOTE — Progress Notes (Signed)
Patient ID: Gavin Harris, male    DOB: 26-Apr-1966, 55 y.o.   MRN: [id number]   Chief Complaint  Patient presents with  . Advice Only    The patient is a 55 year old gentleman here for evaluation of several skin tags.  He has had them in the past and had them removed.  He seems to just get them with time.  He diabetes and some heart disease he is otherwise in good condition.  His last hemoglobin A1c was 7.5 in December 2021.  He is 5 feet 9 inches tall and weighs 217 pounds.  Several skin tags on his upper lids neck and axilla.  None of them appear concerning or worrisome for skin cancer.   Review of Systems  Constitutional: Negative.   HENT: Negative.   Eyes: Negative.   Respiratory: Negative.   Cardiovascular: Negative.   Gastrointestinal: Negative.   Endocrine: Negative.   Genitourinary: Negative.   Musculoskeletal: Negative.   Neurological: Negative.   Hematological: Negative.     Past Medical History:  Diagnosis Date  . CAD (coronary artery disease) 2015   Stent placement  . Diabetes mellitus without complication ([place])   . GERD (gastroesophageal reflux disease)   . Hypercholesteremia   . Hypertension   . Myocardial infarction [facility]) 2014    Past Surgical History:  Procedure Laterality Date  . COLONOSCOPY  05/11/2015   [facility]; tubular adenoma and hyperplastic polyp.  . COLONOSCOPY WITH PROPOFOL N/A 08/29/2020   Procedure: COLONOSCOPY WITH PROPOFOL;  Surgeon: Eloise Harman, DO;  [facility];  Service: Endoscopy;  Laterality: N/A;  AM (diabetic)  . CORONARY ANGIOPLASTY WITH STENT PLACEMENT  2015  . POLYPECTOMY  08/29/2020   Procedure: POLYPECTOMY;  Surgeon: Eloise Harman, DO;  Location[facility];  Service: Endoscopy;;  . tumor in chest removed     at age 55.       Current Outpatient Medications:  .  aspirin 81 MG EC tablet, Take 81 mg by mouth daily., Disp: , Rfl:  .  atorvastatin (LIPITOR) 80 MG tablet, Take 1 tablet  by mouth once daily, Disp: 90 tablet, Rfl: 0 .  glipiZIDE (GLUCOTROL) 5 MG tablet, Take 1 tablet by mouth once daily, Disp: 90 tablet, Rfl: 0 .  JARDIANCE 10 MG TABS tablet, Take 1 tablet by mouth once daily (Patient taking differently: Take 10 mg by mouth daily.), Disp: 90 tablet, Rfl: 0 .  losartan-hydrochlorothiazide (HYZAAR) 100-12.5 MG tablet, Take 1 tablet by mouth once daily, Disp: 90 tablet, Rfl: 0 .  metFORMIN (GLUCOPHAGE) 1000 MG tablet, Take 1 tablet by mouth twice daily (Patient taking differently: Take 1,000 mg by mouth 2 (two) times daily with a meal.), Disp: 180 tablet, Rfl: 0 .  Multiple Vitamin (MULTI-VITAMIN) tablet, Take 1 tablet by mouth daily., Disp: , Rfl:  .  OVER THE COUNTER MEDICATION, Take 3,000 mg by mouth daily. Nugenix Ultimate, Disp: , Rfl:  .  tadalafil (CIALIS) 20 MG tablet, Take 1 tablet (20 mg total) by mouth as needed for erectile dysfunction., Disp: 20 tablet, Rfl: 11   Objective:   Vitals:   10/27/20 1327  BP: 115/76  Pulse: 99  SpO2: 96%    Physical Exam Vitals and nursing note reviewed.  Constitutional:      Appearance: Normal appearance.  HENT:     Head: Normocephalic and atraumatic.  Cardiovascular:     Rate and Rhythm: Normal rate.     Pulses: Normal pulses.  Pulmonary:     Effort: Pulmonary effort is normal.  Abdominal:     General: Abdomen is flat.  Skin:    General: Skin is warm.     Capillary Refill: Capillary refill takes less than 2 seconds.  Neurological:     General: No focal deficit present.     Mental Status: He is alert.  Psychiatric:        Mood and Affect: Mood normal.        Behavior: Behavior normal.     Assessment & Plan:  Changing skin lesion  Happy to remove the skin tags as the patient wishes. Pictures were obtained of the patient and placed in the chart with the patient's or guardian's permission.   Singer, DO

## 2020-10-30 ENCOUNTER — Ambulatory Visit (INDEPENDENT_AMBULATORY_CARE_PROVIDER_SITE_OTHER): Payer: BC Managed Care – PPO | Admitting: Internal Medicine

## 2020-10-30 ENCOUNTER — Other Ambulatory Visit: Payer: Self-pay

## 2020-10-30 ENCOUNTER — Encounter (INDEPENDENT_AMBULATORY_CARE_PROVIDER_SITE_OTHER): Payer: Self-pay | Admitting: Internal Medicine

## 2020-10-30 VITALS — BP 118/78 | HR 91 | Temp 96.9°F | Ht 69.0 in | Wt 224.0 lb

## 2020-10-30 DIAGNOSIS — E1165 Type 2 diabetes mellitus with hyperglycemia: Secondary | ICD-10-CM | POA: Diagnosis not present

## 2020-10-30 DIAGNOSIS — E785 Hyperlipidemia, unspecified: Secondary | ICD-10-CM

## 2020-10-30 DIAGNOSIS — I10 Essential (primary) hypertension: Secondary | ICD-10-CM | POA: Diagnosis not present

## 2020-10-30 NOTE — Progress Notes (Signed)
Metrics: Intervention Frequency ACO  Documented Smoking Status Yearly  Screened one or more times in 24 months  Cessation Counseling or  Active cessation medication Past 24 months  Past 24 months   Guideline developer: UpToDate (See UpToDate for funding source) Date Released: 2014       Wellness Office Visit  Subjective:  Patient ID: Gavin Harris, male    DOB: 1965-07-08  Age: 55 y.o. MRN: [id number]  CC: This man comes in for follow-up of hypertension, diabetes, hyperlipidemia and history of coronary artery disease. HPI  He continues on oral hypoglycemic agents. He continues to smoke cigarettes, approximately 15 cigarettes/day.  He is trying to quit. He continues on statin therapy in the face of coronary artery disease. He continues on antihypertensive medications. He denies any chest pain Past Medical History:  Diagnosis Date  . CAD (coronary artery disease) 2015   Stent placement  . Diabetes mellitus without complication ([place])   . GERD (gastroesophageal reflux disease)   . Hypercholesteremia   . Hypertension   . Myocardial infarction [facility]) 2014   Past Surgical History:  Procedure Laterality Date  . COLONOSCOPY  05/11/2015   [facility] health specialist; tubular adenoma and hyperplastic polyp.  . COLONOSCOPY WITH PROPOFOL N/A 08/29/2020   Procedure: COLONOSCOPY WITH PROPOFOL;  Surgeon: Eloise Harman, DO;  [facility];  Service: Endoscopy;  Laterality: N/A;  AM (diabetic)  . CORONARY ANGIOPLASTY WITH STENT PLACEMENT  2015  . POLYPECTOMY  08/29/2020   Procedure: POLYPECTOMY;  Surgeon: Eloise Harman, DO;  [facility];  Service: Endoscopy;;  . tumor in chest removed     at age 66.      Family History  Problem Relation Age of Onset  . Breast cancer Mother   . Lung cancer Father   . 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   Social History    Tobacco Use  . Smoking status: Heavy Tobacco Smoker    Packs/day: 0.75    Years: 30.00    Pack years: 22.50    Types: Cigarettes  . Smokeless tobacco: Never Used  Substance Use Topics  . Alcohol use: Not Currently    Comment: Quit 3 years ago.  (06/16/2020)    Current Meds  Medication Sig  . aspirin 81 MG EC tablet Take 81 mg by mouth daily.  Marland Kitchen atorvastatin (LIPITOR) 80 MG tablet Take 1 tablet by mouth once daily  . glipiZIDE (GLUCOTROL) 5 MG tablet Take 1 tablet by mouth once daily  . JARDIANCE 10 MG TABS tablet Take 1 tablet by mouth once daily (Patient taking differently: Take 10 mg by mouth daily.)  . losartan-hydrochlorothiazide (HYZAAR) 100-12.5 MG tablet Take 1 tablet by mouth once daily  . metFORMIN (GLUCOPHAGE) 1000 MG tablet Take 1 tablet by mouth twice daily (Patient taking differently: Take 1,000 mg by mouth 2 (two) times daily with a meal.)  . Multiple Vitamin (MULTI-VITAMIN) tablet Take 1 tablet by mouth daily.  Marland Kitchen OVER THE COUNTER MEDICATION Take 3,000 mg by mouth daily. Nugenix Ultimate  . tadalafil (CIALIS) 20 MG tablet Take 1 tablet (20 mg total) by mouth as needed for erectile dysfunction.     [place] Office Visit from 10/30/2020 in [facility]  PHQ-9 Total Score 0      Objective:   Today's Vitals: BP 118/78   Pulse 91   Temp (!) 96.9 F (  36.1 C) (Temporal)   Ht 5\' 9"  (1.753 m)   Wt 224 lb (101.6 kg)   SpO2 92%   BMI 33.08 kg/m  Vitals with BMI 10/30/2020 10/27/2020 08/29/2020  Height 5\' 9"  5\' 9"  -  Weight 224 lbs 217 lbs 3 oz -  BMI 38.32 91.91 -  Systolic 660 600 459  Diastolic 78 76 67  Pulse 91 99 74     Physical Exam  He looks systemically well.  He is obese.  Blood pressure is in good range.     Assessment   1. Type 2 diabetes mellitus with hyperglycemia, without long-term current use of insulin ([place])   2. Hypertension, unspecified type   3. Hyperlipidemia, unspecified hyperlipidemia type       Tests  ordered Orders Placed This Encounter  Procedures  . COMPLETE METABOLIC PANEL WITH GFR  . Hemoglobin A1c  . Lipid panel     Plan: 1. Continue with oral hypoglycemic agents and we will check an A1c. 2. Continue with antihypertensive medications.  We will check renal function. 3. Continue on statin therapy.  I will check a lipid panel. 4. Follow-up with Judson Roch for an annual physical exam in 3 months   No orders of the defined types were placed in this encounter.   Doree Albee, MD

## 2020-10-31 LAB — COMPLETE METABOLIC PANEL WITH GFR
AG Ratio: 2 (calc) (ref 1.0–2.5)
ALT: 34 U/L (ref 9–46)
AST: 19 U/L (ref 10–35)
Albumin: 4.7 g/dL (ref 3.6–5.1)
Alkaline phosphatase (APISO): 77 U/L (ref 35–144)
BUN: 12 mg/dL (ref 7–25)
CO2: 25 mmol/L (ref 20–32)
Calcium: 10 mg/dL (ref 8.6–10.3)
Chloride: 109 mmol/L (ref 98–110)
Creat: 0.95 mg/dL (ref 0.70–1.33)
GFR, Est African American: 105 mL/min/{1.73_m2} (ref >=60)
GFR, Est Non African American: 90 mL/min/{1.73_m2} (ref >=60)
Globulin: 2.4 g/dL (calc) (ref 1.9–3.7)
Glucose, Bld: 181 mg/dL — ABNORMAL HIGH (ref 65–99)
Potassium: 4.3 mmol/L (ref 3.5–5.3)
Sodium: 144 mmol/L (ref 135–146)
Total Bilirubin: 0.4 mg/dL (ref 0.2–1.2)
Total Protein: 7.1 g/dL (ref 6.1–8.1)

## 2020-10-31 LAB — LIPID PANEL
Cholesterol: 118 mg/dL (ref <200)
HDL: 40 mg/dL (ref >=40)
LDL Cholesterol (Calc): 61 mg/dL (calc)
Non-HDL Cholesterol (Calc): 78 mg/dL (calc) (ref <130)
Total CHOL/HDL Ratio: 3 (calc) (ref <5.0)
Triglycerides: 83 mg/dL (ref <150)

## 2020-10-31 LAB — HEMOGLOBIN A1C
Hgb A1c MFr Bld: 7.9 % of total Hgb — ABNORMAL HIGH (ref <5.7)
Mean Plasma Glucose: 180 mg/dL
eAG (mmol/L): 10 mmol/L

## 2020-11-19 ENCOUNTER — Other Ambulatory Visit (INDEPENDENT_AMBULATORY_CARE_PROVIDER_SITE_OTHER): Payer: Self-pay | Admitting: Nurse Practitioner

## 2020-11-19 DIAGNOSIS — E1165 Type 2 diabetes mellitus with hyperglycemia: Secondary | ICD-10-CM

## 2020-11-19 DIAGNOSIS — Z1159 Encounter for screening for other viral diseases: Secondary | ICD-10-CM

## 2020-12-11 ENCOUNTER — Other Ambulatory Visit (INDEPENDENT_AMBULATORY_CARE_PROVIDER_SITE_OTHER): Payer: Self-pay | Admitting: Internal Medicine

## 2020-12-24 ENCOUNTER — Other Ambulatory Visit (INDEPENDENT_AMBULATORY_CARE_PROVIDER_SITE_OTHER): Payer: Self-pay | Admitting: Internal Medicine

## 2020-12-25 ENCOUNTER — Encounter (INDEPENDENT_AMBULATORY_CARE_PROVIDER_SITE_OTHER): Payer: BC Managed Care – PPO | Admitting: Nurse Practitioner

## 2020-12-25 ENCOUNTER — Encounter (INDEPENDENT_AMBULATORY_CARE_PROVIDER_SITE_OTHER): Payer: BC Managed Care – PPO | Admitting: Internal Medicine

## 2021-01-08 ENCOUNTER — Encounter (INDEPENDENT_AMBULATORY_CARE_PROVIDER_SITE_OTHER): Payer: BC Managed Care – PPO | Admitting: Internal Medicine

## 2021-01-12 ENCOUNTER — Other Ambulatory Visit: Payer: Self-pay

## 2021-01-12 ENCOUNTER — Ambulatory Visit (INDEPENDENT_AMBULATORY_CARE_PROVIDER_SITE_OTHER): Payer: Self-pay | Admitting: Plastic Surgery

## 2021-01-12 ENCOUNTER — Encounter: Payer: Self-pay | Admitting: Plastic Surgery

## 2021-01-12 VITALS — BP 129/89 | HR 81

## 2021-01-12 DIAGNOSIS — L989 Disorder of the skin and subcutaneous tissue, unspecified: Secondary | ICD-10-CM

## 2021-01-17 ENCOUNTER — Telehealth (INDEPENDENT_AMBULATORY_CARE_PROVIDER_SITE_OTHER): Payer: Self-pay

## 2021-01-17 DIAGNOSIS — E785 Hyperlipidemia, unspecified: Secondary | ICD-10-CM

## 2021-01-17 MED ORDER — ATORVASTATIN CALCIUM 80 MG PO TABS: 80.0000 mg | ORAL_TABLET | Freq: Every day | ORAL | 0 refills | Status: DC

## 2021-01-17 NOTE — Telephone Encounter (Signed)
Received a refill request from [facility] in [place] for the following medication:  atorvastatin (LIPITOR) 80 MG tablet  Last filled 10/15/2020, # 90 with 0 refills

## 2021-01-24 NOTE — Progress Notes (Signed)
Procedure Note  Preoperative Dx: Skin lesions  Postoperative Dx: Same  Procedure: Excision of 4 skin lesions 2-4 mm on axilla and neck  Anesthesia: Lidocaine 1% with 1:100,000 epinepherine   Description of Procedure: Risks and complications were explained to the patient.  Consent was confirmed and the patient understands the risks and benefits.  The potential complications and alternatives were explained and the patient consents.  The patient expressed understanding the option of not having the procedure and the risks of a scar.  Time out was called and all information was confirmed to be correct.    The area was prepped and drapped.  Lidocaine 1% with epinepherine was injected in the subcutaneous area.  After waiting several minutes for the local to take affect a pair of tissue scissors were used to excise the 4 lesions.  The patient was given instructions on how to care for the area and a follow up appointment.  Gavin Harris tolerated the procedure well and there were no complications.

## 2021-01-26 ENCOUNTER — Ambulatory Visit: Payer: BC Managed Care – PPO | Admitting: Surgical

## 2021-01-30 ENCOUNTER — Telehealth: Payer: Self-pay

## 2021-01-30 DIAGNOSIS — I10 Essential (primary) hypertension: Secondary | ICD-10-CM

## 2021-01-30 NOTE — Telephone Encounter (Signed)
Pt friend Tammy called and states pt was accepted as a new pt here and has an appt scheduled for November however he is needing his Losartan refilled now. Would you be able to send this in?

## 2021-01-31 ENCOUNTER — Encounter (INDEPENDENT_AMBULATORY_CARE_PROVIDER_SITE_OTHER): Payer: BC Managed Care – PPO | Admitting: Nurse Practitioner

## 2021-01-31 MED ORDER — LOSARTAN POTASSIUM-HCTZ 100-12.5 MG PO TABS: 1.0000 | ORAL_TABLET | Freq: Every day | ORAL | 0 refills | Status: DC

## 2021-01-31 NOTE — Addendum Note (Signed)
Addended by: Jeralyn Ruths E on: 01/31/2021 05:04 PM   Modules accepted: Orders

## 2021-01-31 NOTE — Telephone Encounter (Signed)
Yes, I will send in a 90-day supply of his losartan-HCTZ. Refill sent to [facility] in [place].

## 2021-02-04 ENCOUNTER — Other Ambulatory Visit: Payer: Self-pay | Admitting: Urology

## 2021-02-04 DIAGNOSIS — N529 Male erectile dysfunction, unspecified: Secondary | ICD-10-CM

## 2021-02-06 ENCOUNTER — Encounter: Payer: Self-pay | Admitting: Cardiology

## 2021-02-06 ENCOUNTER — Ambulatory Visit (INDEPENDENT_AMBULATORY_CARE_PROVIDER_SITE_OTHER): Payer: BC Managed Care – PPO | Admitting: Cardiology

## 2021-02-06 VITALS — BP 133/90 | HR 85 | Ht 69.0 in | Wt 225.8 lb

## 2021-02-06 DIAGNOSIS — E782 Mixed hyperlipidemia: Secondary | ICD-10-CM | POA: Diagnosis not present

## 2021-02-06 DIAGNOSIS — I2510 Atherosclerotic heart disease of native coronary artery without angina pectoris: Secondary | ICD-10-CM

## 2021-02-06 DIAGNOSIS — I10 Essential (primary) hypertension: Secondary | ICD-10-CM

## 2021-02-06 NOTE — Progress Notes (Signed)
Clinical Summary Mr. Kham is a 55 y.o.male seen today for follow up of the following medical problems.   Previously followed by [facility] cardiology     1. CAD - history of STEMI in 2016, received DES to RCA. He had moderate nonobstructive disease in the LAD. He had mildly reduced LV function with an EF of 45-50% by echo in September 2016.  12/2018 neg stress echo   -no recent chest pain, no SOb/DOE - compliant with meds  2. HTN - he is compliant with meds     3. Hyperlipidemia 10/2019 TC 11 HDL 32 TG 157 LDL 56 10/2020 TC 118 HDL 40 TG 83 LDL 61     4. 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 [place])    GERD (gastroesophageal reflux disease)    Hypercholesteremia    Hypertension    Myocardial infarction ([place]) 2014     Allergies  Allergen Reactions   Penicillins Hives and Other (See Comments)    Mother told him      Current Outpatient Medications  Medication Sig Dispense Refill   aspirin 81 MG EC tablet Take 81 mg by mouth daily.     atorvastatin (LIPITOR) 80 MG tablet Take 1 tablet (80 mg total) by mouth daily. 90 tablet 0   glipiZIDE (GLUCOTROL) 5 MG tablet Take 1 tablet by mouth once daily 90 tablet 0   JARDIANCE 10 MG TABS tablet Take 1 tablet by mouth once daily 90 tablet 0   losartan-hydrochlorothiazide (HYZAAR) 100-12.5 MG tablet Take 1 tablet by mouth daily. 90 tablet 0   metFORMIN (GLUCOPHAGE) 1000 MG tablet Take 1 tablet (1,000 mg total) by mouth 2 (two) times daily with a meal. 180 tablet 0   Multiple Vitamin (MULTI-VITAMIN) tablet Take 1 tablet by mouth daily.     OVER THE COUNTER MEDICATION Take 3,000 mg by mouth daily. Nugenix Ultimate     tadalafil (CIALIS) 20 MG tablet TAKE 1 TABLET BY MOUTH AS NEEDED FOR ERECTILE DYSFUNCTION 20 tablet 11   No  current facility-administered medications for this visit.     Past Surgical History:  Procedure Laterality Date   COLONOSCOPY  05/11/2015   Digestive health specialist; tubular adenoma and hyperplastic polyp.   COLONOSCOPY WITH PROPOFOL N/A 08/29/2020   Procedure: COLONOSCOPY WITH PROPOFOL;  Surgeon: Eloise Harman, DO;  [facility];  Service: Endoscopy;  Laterality: N/A;  AM (diabetic)   CORONARY ANGIOPLASTY WITH STENT PLACEMENT  2015   POLYPECTOMY  08/29/2020   Procedure: POLYPECTOMY;  Surgeon: Eloise Harman, DO;  [facility];  Service: Endoscopy;;   tumor in chest removed     at age 73.      Allergies  Allergen Reactions   Penicillins Hives and Other (See Comments)    Mother told him       Family History  Problem Relation Age of Onset   Breast cancer Mother    Lung cancer Father    Colon cancer Neg Hx      Social History Mr. Dinardi reports that he has been smoking cigarettes. He has a 22.50 pack-year smoking history. He has never used smokeless tobacco. Mr. Lovick reports that he does not currently use alcohol.  Review of Systems CONSTITUTIONAL: No weight loss, fever, chills, weakness or fatigue.  HEENT: Eyes: No visual loss, blurred vision, double vision or yellow sclerae.No hearing loss, sneezing, congestion, runny nose or sore throat.  SKIN: No rash or itching.  CARDIOVASCULAR: per hpi RESPIRATORY: No shortness of breath, cough or sputum.  GASTROINTESTINAL: No anorexia, nausea, vomiting or diarrhea. No abdominal pain or blood.  GENITOURINARY: No burning on urination, no polyuria NEUROLOGICAL: No headache, dizziness, syncope, paralysis, ataxia, numbness or tingling in the extremities. No change in bowel or bladder control.  MUSCULOSKELETAL: No muscle, back pain, joint pain or stiffness.  LYMPHATICS: No enlarged nodes. No history of splenectomy.  PSYCHIATRIC: No history of depression or anxiety.  ENDOCRINOLOGIC: No reports of sweating,  cold or heat intolerance. No polyuria or polydipsia.  Marland Kitchen   Physical Examination Today's Vitals   02/06/21 0947  BP: 133/90  Pulse: 85  SpO2: 97%  Weight: 225 lb 12.8 oz (102.4 kg)  Height: '5\' 9"'$  (1.753 m)   Body mass index is 33.34 kg/m.  Gen: resting comfortably, no acute distress HEENT: no scleral icterus, pupils equal round and reactive, no palptable cervical adenopathy,  CV: RRR, no m/r/g, no jvd Resp: Clear to auscultation bilaterally GI: abdomen is soft, non-tender, non-distended, normal bowel sounds, no hepatosplenomegaly MSK: extremities are warm, no edema.  Skin: warm, no rash Neuro:  no focal deficits Psych: appropriate affect   Diagnostic Studies  12/2018 stress [facility] SUMMARY The patient had no chest pain. The patient achieved 89 % of maximum predicted heart rate. The METS achieved was 8. Exercise capacity was average. LV function mildly decreased at rest: 45%. There is inferior wall and inferolateral wall hypokinesis No augmentation of this wall with stress Negative stress ECG for inducible ischemia at target heart rate. No new wall motion with stress The stress is negative for ischemia and shows prior RCA territory infarction - FINDINGS: - ECG REST The baseline ECG displays normal sinus rhythm. RBBB. Baseline ECG showed  inferior myocardial infarction. - ECG STRESS No diagnostic ST segment changes were seen. Negative stress ECG for inducible  ischemia at target heart rate. ECG changes, blood pressure and heart rate  responses during stress test are shown in the Cardiology Scan portion of this  report in the EPIC. - REST ECHO LV function mildly decreased at rest 45%. There is inferior wall and inferolateral wall hypokinesis No augmentation of this wall with stress. - STRESS ECHO There were no segmental wall motion abnormalities post exercise. The  estimated LV ejection fraction is 55-60% with stress. Negative exercise  echocardiography  for inducible ischemia at target heart rate.   01/2015 Echo [place]  The left ventricular size is normal.   Left ventricular systolic function is mildly reduced.  LV ejection fraction = 45-50%.   Regional wall motion abnormalities as specified below.  The right ventricle is normal in size and function.  The left atrial size is normal.  Structurally normal aortic valve.  There is no aortic regurgitation.  The mitral valve leaflets appear normal.  There is trace mitral regurgitation.  Structurally normal tricuspid valve.  There is trace tricuspid regurgitation.  Structurally normal pulmonic valve.  There is no pulmonic valvular regurgitation.  There is no pericardial effusion.  There is no comparison study available.      01/2015 [facility] OTHER:  Patient underwent successful primary PCI of RCA. LAD has several  50-60% proximal to mid stenoses. LCX has diffuse disease and small. RCA  is a  large vessel with 100% thrombotic occlusion. RCA lesion was stented with a  3.5X34m Premier followed by post-dilation with 4.034mNC balloon. There is 0%  residual stenosis and TIMI III flow. Thrombectomy with Export catheter was  performed. Intra-coronary ReoPro was used. Patient will be on IV ReoPro for  12 hrs. There is no procedural complication. Loss of blood is minimal. 11        Assessment and Plan  1. CAD - no symptoms, continue current meds   2. HTN - manual recheck 130/85, essentialyl right at goal. Continue current meds    3. Hyperlipidemia - lipids are at goal, continue current meds      JoArnoldo LenisM.D.

## 2021-02-06 NOTE — Patient Instructions (Signed)
Medication Instructions:  Continue all current medications.   Labwork: none  Testing/Procedures: none  Follow-Up: 6 months   Any Other Special Instructions Will Be Listed Below (If Applicable).   If you need a refill on your cardiac medications before your next appointment, please call your pharmacy.  

## 2021-02-23 ENCOUNTER — Telehealth: Payer: Self-pay | Admitting: Internal Medicine

## 2021-02-23 NOTE — Telephone Encounter (Signed)
Patient will be coming in as a new patient in Nov but called in refrence to getting a prescription refill for [facility]

## 2021-02-23 NOTE — Telephone Encounter (Signed)
Patient will be coming in as a new patient in Nov, but called in reference to a prescription refill for Jardiance

## 2021-02-28 ENCOUNTER — Encounter: Payer: Self-pay | Admitting: *Deleted

## 2021-02-28 ENCOUNTER — Telehealth: Payer: Self-pay

## 2021-02-28 NOTE — Telephone Encounter (Signed)
Patient called for Jardiance refill. He has a new patient appointment to establish care with [facility] on 04/16/21. Advised to go to UC for evaluation and bridge refill of this medication until his upcoming appointment. Patient agreed.

## 2021-02-28 NOTE — Telephone Encounter (Signed)
Patient called, left VM to return the call to speak to a nurse about his refill [phone]. Called his emergency contact [place], who she says she's listed to speak to [place] about him, advised no documentation in the chart. She was just wanting to know if he would need to go to the urgent care to receive a refill. I advised that is an option because if a provider hasn't seen the patient in the office it may not get refilled. She verbalized understanding and says she will let him know.

## 2021-02-28 NOTE — Telephone Encounter (Signed)
This encounter was created in error - please disregard.

## 2021-02-28 NOTE — Telephone Encounter (Signed)
Medication Refill - Medication: JARDIANCE 10 MG TABS tablet Pts provider passed and office closed / they have an appt with a new office in November but are out of meds and needs refill/ they contacted new pcp office but they will not refill / please advise  Has the patient contacted their pharmacy? Yes.   (Agent: If no, request that the patient contact the pharmacy for the refill.) (Agent: If yes, when and what did the pharmacy advise?)they are a former Dr. Anastasio Champion pt and has scheduled with a new pcp for November / they were advised to call pcp  Preferred Pharmacy (with phone number or street name): [place], [place] - [address]  [address] [place], [place] [address]  Phone:  [phone]  Fax:  [phone]  Has the patient been seen for an appointment in the last year OR does the patient have an upcoming appointment? Yes.    Agent: Please be advised that RX refills may take up to 3 business days. We ask that you follow-up with your pharmacy.

## 2021-03-03 ENCOUNTER — Encounter: Payer: Self-pay | Admitting: Emergency Medicine

## 2021-03-03 ENCOUNTER — Other Ambulatory Visit: Payer: Self-pay

## 2021-03-03 ENCOUNTER — Ambulatory Visit
Admission: EM | Admit: 2021-03-03 | Discharge: 2021-03-03 | Disposition: A | Discharge status: Home / Self Care | Payer: BC Managed Care – PPO | Attending: Family Medicine | Admitting: Family Medicine

## 2021-03-03 DIAGNOSIS — I10 Essential (primary) hypertension: Secondary | ICD-10-CM | POA: Diagnosis not present

## 2021-03-03 DIAGNOSIS — E1165 Type 2 diabetes mellitus with hyperglycemia: Secondary | ICD-10-CM

## 2021-03-03 MED ORDER — GLIPIZIDE 5 MG PO TABS: 5.0000 mg | ORAL_TABLET | Freq: Every day | ORAL | 0 refills | Status: DC

## 2021-03-03 MED ORDER — LOSARTAN POTASSIUM-HCTZ 100-12.5 MG PO TABS: 1.0000 | ORAL_TABLET | Freq: Every day | ORAL | 0 refills | Status: DC

## 2021-03-03 MED ORDER — EMPAGLIFLOZIN 10 MG PO TABS: 10.0000 mg | ORAL_TABLET | Freq: Every day | ORAL | 0 refills | Status: DC

## 2021-03-03 NOTE — ED Triage Notes (Signed)
Patient presents for medication refill.   Patient is unable to get primary care visit till next month due to the availability of appointments.   Patient needs Glipizide 5mg  and Jardiance 10mg .

## 2021-03-05 NOTE — ED Provider Notes (Signed)
[place]   [id number] 03/03/21 Arrival Time: 5361  ASSESSMENT & PLAN:  1. Hypertension, unspecified type   2. Type 2 diabetes mellitus with hyperglycemia, without long-term current use of insulin (HCC)    Refilled at request. Meds ordered this encounter  Medications   empagliflozin (JARDIANCE) 10 MG TABS tablet    Sig: Take 1 tablet (10 mg total) by mouth daily.    Dispense:  90 tablet    Refill:  0   glipiZIDE (GLUCOTROL) 5 MG tablet    Sig: Take 1 tablet (5 mg total) by mouth daily.    Dispense:  90 tablet    Refill:  0   losartan-hydrochlorothiazide (HYZAAR) 100-12.5 MG tablet    Sig: Take 1 tablet by mouth daily.    Dispense:  90 tablet    Refill:  0    Keep PCP appt.   Reviewed expectations re: course of current medical issues. Questions answered. Outlined signs and symptoms indicating need for more acute intervention. Patient verbalized understanding. After Visit Summary given.   SUBJECTIVE:  Gavin Harris is a 55 y.o. male who presents with concerns regarding increased blood pressures. He is tx for HTN and DM. Needs refills. Feeling well. Has appt with new PCP next month.  He reports taking medications as instructed, no medication side effects noted, no chest pain on exertion, no dyspnea on exertion, no swelling of ankles, and no palpitations.  Denies symptoms of chest pain, palpations, orthopnea, nocturnal dyspnea, or LE edema.  Social History   Tobacco Use  Smoking Status Heavy Smoker   Packs/day: 0.75   Years: 30.00   Pack years: 22.50   Types: Cigarettes  Smokeless Tobacco Never     ROS: As per HPI.   OBJECTIVE:  Vitals:   03/03/21 1118  BP: 132/87  Pulse: 90  Resp: 15  Temp: 98.6 F (37 C)  TempSrc: Oral  SpO2: 95%    General appearance: alert; no distress Eyes: PERRLA; EOMI HENT: normocephalic; atraumatic Neck: supple Lungs: clear to auscultation bilaterally Heart: regular Abdomen: soft, non-tender; bowel sounds  normal Extremities: no edema; symmetrical with no gross deformities Skin: warm and dry Psychological: alert and cooperative; normal mood and affect    Labs Reviewed: Results for orders placed or performed in visit on 10/30/20  COMPLETE METABOLIC PANEL WITH GFR  Result Value Ref Range   Glucose, Bld 181 (H) 65 - 99 mg/dL   BUN 12 7 - 25 mg/dL   Creat 0.95 0.70 - 1.33 mg/dL   GFR, Est Non African American 90 > OR = 60 mL/min/1.63m2   GFR, Est African American 105 > OR = 60 mL/min/1.86m2   BUN/Creatinine Ratio NOT APPLICABLE 6 - 22 (calc)   Sodium 144 135 - 146 mmol/L   Potassium 4.3 3.5 - 5.3 mmol/L   Chloride 109 98 - 110 mmol/L   CO2 25 20 - 32 mmol/L   Calcium 10.0 8.6 - 10.3 mg/dL   Total Protein 7.1 6.1 - 8.1 g/dL   Albumin 4.7 3.6 - 5.1 g/dL   Globulin 2.4 1.9 - 3.7 g/dL (calc)   AG Ratio 2.0 1.0 - 2.5 (calc)   Total Bilirubin 0.4 0.2 - 1.2 mg/dL   Alkaline phosphatase (APISO) 77 35 - 144 U/L   AST 19 10 - 35 U/L   ALT 34 9 - 46 U/L  Hemoglobin A1c  Result Value Ref Range   Hgb A1c MFr Bld 7.9 (H) <5.7 % of total Hgb   Mean  Plasma Glucose 180 mg/dL   eAG (mmol/L) 10.0 mmol/L  Lipid panel  Result Value Ref Range   Cholesterol 118 <200 mg/dL   HDL 40 > OR = 40 mg/dL   Triglycerides 83 <150 mg/dL   LDL Cholesterol (Calc) 61 mg/dL (calc)   Total CHOL/HDL Ratio 3.0 <5.0 (calc)   Non-HDL Cholesterol (Calc) 78 <130 mg/dL (calc)   Labs Reviewed - No data to display   Allergies  Allergen Reactions   Penicillins Hives and Other (See Comments)    Mother told him     Past Medical History:  Diagnosis Date   CAD (coronary artery disease) 2015   Stent placement   Diabetes mellitus without complication (HCC)    GERD (gastroesophageal reflux disease)    Hypercholesteremia    Hypertension    Myocardial infarction [facility]) 2014   Social History   Socioeconomic History   Marital status: Single    Spouse name: Not on file   Number of children: 3   Years of education:  Not on file   Highest education level: Not on file  Occupational History   Occupation: truck driver  Tobacco Use   Smoking status: Heavy Smoker    Packs/day: 0.75    Years: 30.00    Pack years: 22.50    Types: Cigarettes   Smokeless tobacco: Never  Vaping Use   Vaping Use: Never used  Substance and Sexual Activity   Alcohol use: Not Currently    Comment: Quit 3 years ago.  (06/16/2020)   Drug use: Not Currently    Types: Marijuana, Cocaine    Comment: 30 years ago.    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   Social Determinants of Health   Financial Resource Strain: Not on file  Food Insecurity: Not on file  Transportation Needs: Not on file  Physical Activity: Not on file  Stress: Not on file  Social Connections: Not on file  Intimate Partner Violence: Not on file   Family History  Problem Relation Age of Onset   Breast cancer Mother    Lung cancer Father    Colon cancer Neg Hx    Past Surgical History:  Procedure Laterality Date   COLONOSCOPY  05/11/2015   Digestive health specialist; tubular adenoma and hyperplastic polyp.   COLONOSCOPY WITH PROPOFOL N/A 08/29/2020   Procedure: COLONOSCOPY WITH PROPOFOL;  Surgeon: Eloise Harman, DO;  [facility];  Service: Endoscopy;  Laterality: N/A;  AM (diabetic)   CORONARY ANGIOPLASTY WITH STENT PLACEMENT  2015   POLYPECTOMY  08/29/2020   Procedure: POLYPECTOMY;  Surgeon: Eloise Harman, DO;  [facility];  Service: Endoscopy;;   tumor in chest removed     at age 68.        Vanessa Kick, MD 03/05/21 [phone]

## 2021-03-12 NOTE — Telephone Encounter (Signed)
We cannot refill any medications until patient comes in for new patient visit

## 2021-04-16 ENCOUNTER — Ambulatory Visit (INDEPENDENT_AMBULATORY_CARE_PROVIDER_SITE_OTHER): Payer: BC Managed Care – PPO | Admitting: Internal Medicine

## 2021-04-16 ENCOUNTER — Other Ambulatory Visit: Payer: Self-pay

## 2021-04-16 ENCOUNTER — Encounter: Payer: Self-pay | Admitting: Internal Medicine

## 2021-04-16 VITALS — BP 134/82 | HR 94 | Resp 18 | Ht 69.0 in | Wt 227.1 lb

## 2021-04-16 DIAGNOSIS — K219 Gastro-esophageal reflux disease without esophagitis: Secondary | ICD-10-CM | POA: Diagnosis not present

## 2021-04-16 DIAGNOSIS — I10 Essential (primary) hypertension: Secondary | ICD-10-CM

## 2021-04-16 DIAGNOSIS — E1165 Type 2 diabetes mellitus with hyperglycemia: Secondary | ICD-10-CM

## 2021-04-16 DIAGNOSIS — E559 Vitamin D deficiency, unspecified: Secondary | ICD-10-CM

## 2021-04-16 DIAGNOSIS — N529 Male erectile dysfunction, unspecified: Secondary | ICD-10-CM

## 2021-04-16 DIAGNOSIS — E66811 Obesity, class 1: Secondary | ICD-10-CM

## 2021-04-16 DIAGNOSIS — H903 Sensorineural hearing loss, bilateral: Secondary | ICD-10-CM | POA: Insufficient documentation

## 2021-04-16 DIAGNOSIS — Z720 Tobacco use: Secondary | ICD-10-CM

## 2021-04-16 DIAGNOSIS — I2510 Atherosclerotic heart disease of native coronary artery without angina pectoris: Secondary | ICD-10-CM

## 2021-04-16 DIAGNOSIS — E782 Mixed hyperlipidemia: Secondary | ICD-10-CM

## 2021-04-16 DIAGNOSIS — E669 Obesity, unspecified: Secondary | ICD-10-CM

## 2021-04-16 DIAGNOSIS — Z2821 Immunization not carried out because of patient refusal: Secondary | ICD-10-CM

## 2021-04-16 LAB — HM DIABETES EYE EXAM

## 2021-04-16 MED ORDER — ATORVASTATIN CALCIUM 80 MG PO TABS: 80.0000 mg | ORAL_TABLET | Freq: Every day | ORAL | 1 refills | Status: DC

## 2021-04-16 MED ORDER — METFORMIN HCL 1000 MG PO TABS
1000.0000 mg | ORAL_TABLET | Freq: Two times a day (BID) | ORAL | 1 refills | Status: DC
Start: 2021-04-16 — End: 2022-06-03

## 2021-04-16 NOTE — Assessment & Plan Note (Signed)
Lab Results  Component Value Date   HGBA1C 7.9 (H) 10/30/2020    On metformin, glipizide and Jardiance Advised to follow diabetic diet On statin and ARB F/u CMP and lipid panel Diabetic foot exam: Today Diabetic eye exam: Advised to follow up with Ophthalmology for diabetic eye exam

## 2021-04-16 NOTE — Assessment & Plan Note (Signed)
S/p stent placement in 2016 On Aspirin and statin Followed by Cardiology

## 2021-04-16 NOTE — Assessment & Plan Note (Signed)
Advised to avoid hot and spicy food

## 2021-04-16 NOTE — Assessment & Plan Note (Signed)
Takes Cialis as needed

## 2021-04-16 NOTE — Assessment & Plan Note (Signed)
Recently quit smoking in the last week

## 2021-04-16 NOTE — Patient Instructions (Signed)
Please continue to take medications as prescribed. ? ?Please continue to follow low carb diet and perform moderate exercise/walking at least 150 mins/week. ?

## 2021-04-16 NOTE — Assessment & Plan Note (Signed)
Takes Atorvastatin 80 mg QD Check lipid profile 

## 2021-04-16 NOTE — Assessment & Plan Note (Signed)
Diet modification and moderate exercise advised. 

## 2021-04-16 NOTE — Progress Notes (Signed)
New Patient Office Visit  Subjective:  Patient ID: Gavin Harris, male    DOB: 08/24/1965  Age: 55 y.o. MRN: [id number]  CC:  Chief Complaint  Patient presents with   New Patient (Initial Visit)    New patient was seeing gosrani just establishing care    HPI Gavin Harris is a 55 y.o. male with past medical history of CAD s/p stent placement, HTN, GERD, type II DM, tobacco abuse and obesity who presents for establishing care.  CAD and HTN: He had stent placement after STEMI in 2016.  He sees Dr. Harl Bowie for CAD.  BP is well-controlled. Takes medications regularly. Patient denies headache, dizziness, chest pain, dyspnea or palpitations.  Type II DM: His HbA1C was 7.9 in 10/2020. He takes Metformin, Glipizide and Jardiance. He denies any polyuria or polydipsia.  He takes atorvastatin for HLD.  Denies any numbness or tingling of the feet.  He recently quit smoking in the last week.  He takes Cialis for erectile dysfunction.  Denies any dysuria, hematuria or urethral discharge.  He denies flu and COVID-vaccine.   Past Medical History:  Diagnosis Date   Acute ST elevation myocardial infarction (STEMI) ([place]) 02/07/2015   CAD (coronary artery disease) 2015   Stent placement   Diabetes mellitus without complication (HCC)    GERD (gastroesophageal reflux disease)    Hypercholesteremia    Hypertension    Myocardial infarction [facility]) 2014    Past Surgical History:  Procedure Laterality Date   COLONOSCOPY  05/11/2015   Digestive health specialist; tubular adenoma and hyperplastic polyp.   COLONOSCOPY WITH PROPOFOL N/A 08/29/2020   Procedure: COLONOSCOPY WITH PROPOFOL;  Surgeon: Eloise Harman, DO;  [facility];  Service: Endoscopy;  Laterality: N/A;  AM (diabetic)   CORONARY ANGIOPLASTY WITH STENT PLACEMENT  2015   POLYPECTOMY  08/29/2020   Procedure: POLYPECTOMY;  Surgeon: Eloise Harman, DO;  [facility];  Service: Endoscopy;;   tumor in chest removed      at age 16.     Family History  Problem Relation Age of Onset   Breast cancer Mother    Lung cancer Father    Colon cancer Neg Hx     Social History   Socioeconomic History   Marital status: Single    Spouse name: Not on file   Number of children: 3   Years of education: Not on file   Highest education level: Not on file  Occupational History   Occupation: truck driver  Tobacco Use   Smoking status: Former    Packs/day: 0.75    Years: 30.00    Pack years: 22.50    Types: Cigarettes    Quit date: 04/14/2021   Smokeless tobacco: Never  Vaping Use   Vaping Use: Never used  Substance and Sexual Activity   Alcohol use: Not Currently    Comment: Quit 3 years ago.  (06/16/2020)   Drug use: Not Currently    Types: Marijuana, Cocaine    Comment: 30 years ago.    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   Social Determinants of Health   Financial Resource Strain: Not on file  Food Insecurity: Not on file  Transportation Needs: Not on file  Physical Activity: Not on file  Stress: Not on file  Social Connections: Not on file  Intimate Partner Violence: Not on file  ROS Review of Systems  Constitutional:  Negative for chills and fever.  HENT:  Negative for congestion and sore throat.   Eyes:  Negative for pain and discharge.  Respiratory:  Negative for cough and shortness of breath.   Cardiovascular:  Negative for chest pain and palpitations.  Gastrointestinal:  Negative for constipation, diarrhea, nausea and vomiting.  Endocrine: Negative for polydipsia and polyuria.  Genitourinary:  Negative for dysuria and hematuria.  Musculoskeletal:  Negative for neck pain and neck stiffness.  Skin:  Negative for rash.  Neurological:  Negative for dizziness, weakness, numbness and headaches.  Psychiatric/Behavioral:  Negative for agitation and behavioral problems.     Objective:   Today's Vitals: BP 134/82 (BP Location: Left Arm, Cuff Size: Normal)   Pulse 94   Resp 18   Ht '5\' 9"'  (1.753 m)   Wt 227 lb 1.3 oz (103 kg)   SpO2 95%   BMI 33.53 kg/m   Physical Exam Vitals reviewed.  Constitutional:      General: He is not in acute distress.    Appearance: He is not diaphoretic.  HENT:     Head: Normocephalic and atraumatic.     Nose: Nose normal.     Mouth/Throat:     Mouth: Mucous membranes are moist.  Eyes:     General: No scleral icterus.    Extraocular Movements: Extraocular movements intact.  Cardiovascular:     Rate and Rhythm: Normal rate and regular rhythm.     Pulses: Normal pulses.     Heart sounds: Normal heart sounds. No murmur heard. Pulmonary:     Breath sounds: Normal breath sounds. No wheezing or rales.  Abdominal:     Palpations: Abdomen is soft.     Tenderness: There is no abdominal tenderness.  Musculoskeletal:     Cervical back: Neck supple. No tenderness.     Right lower leg: No edema.     Left lower leg: No edema.  Skin:    General: Skin is warm.     Findings: No rash.  Neurological:     General: No focal deficit present.     Mental Status: He is alert and oriented to person, place, and time.     Sensory: No sensory deficit.     Motor: No weakness.  Psychiatric:        Mood and Affect: Mood normal.        Behavior: Behavior normal.    Assessment & Plan:   Problem List Items Addressed This Visit       Cardiovascular and Mediastinum   Coronary artery disease involving native heart without angina pectoris - Primary    S/p stent placement in 2016 On Aspirin and statin Followed by Cardiology      Relevant Medications   atorvastatin (LIPITOR) 80 MG tablet   Other Relevant Orders   TSH   CMP14+EGFR   CBC with Differential/Platelet   Lipid Profile   Essential hypertension    BP Readings from Last 1 Encounters:  04/16/21 134/82  Well-controlled with losaran-HCTZ Counseled for compliance with the  medications Advised DASH diet and moderate exercise/walking, at least 150 mins/week       Relevant Medications   atorvastatin (LIPITOR) 80 MG tablet   Other Relevant Orders   TSH   CMP14+EGFR     Digestive   GERD (gastroesophageal reflux disease)    Advised to avoid hot and spicy food        Endocrine   Type 2 diabetes mellitus  with hyperglycemia, without long-term current use of insulin (HCC)    Lab Results  Component Value Date   HGBA1C 7.9 (H) 10/30/2020   On metformin, glipizide and Jardiance Advised to follow diabetic diet On statin and ARB F/u CMP and lipid panel Diabetic foot exam: Today Diabetic eye exam: Advised to follow up with Ophthalmology for diabetic eye exam       Relevant Medications   atorvastatin (LIPITOR) 80 MG tablet   metFORMIN (GLUCOPHAGE) 1000 MG tablet   Other Relevant Orders   Hemoglobin A1c   CMP14+EGFR     Other   Erectile dysfunction    Takes Cialis as needed      Obesity (BMI 30.0-34.9)    Diet modification and moderate exercise advised      Relevant Orders   TSH   Tobacco abuse    Recently quit smoking in the last week      Mixed hyperlipidemia    Takes Atorvastatin 80 mg QD Check lipid profile      Relevant Medications   atorvastatin (LIPITOR) 80 MG tablet   Other Relevant Orders   Lipid Profile   Other Visit Diagnoses     Vitamin D deficiency       Relevant Orders   VITAMIN D 25 Hydroxy (Vit-D Deficiency, Fractures)   Refused influenza vaccine            Outpatient Encounter Medications as of 04/16/2021  Medication Sig   aspirin 81 MG EC tablet Take 81 mg by mouth daily.   empagliflozin (JARDIANCE) 10 MG TABS tablet Take 1 tablet (10 mg total) by mouth daily.   glipiZIDE (GLUCOTROL) 5 MG tablet Take 1 tablet (5 mg total) by mouth daily.   losartan-hydrochlorothiazide (HYZAAR) 100-12.5 MG tablet Take 1 tablet by mouth daily.   Multiple Vitamin (MULTI-VITAMIN) tablet Take 1 tablet by mouth daily.   OVER  THE COUNTER MEDICATION Take 3,000 mg by mouth daily. Nugenix Ultimate   tadalafil (CIALIS) 20 MG tablet TAKE 1 TABLET BY MOUTH AS NEEDED FOR ERECTILE DYSFUNCTION   [DISCONTINUED] atorvastatin (LIPITOR) 80 MG tablet Take 1 tablet (80 mg total) by mouth daily.   [DISCONTINUED] metFORMIN (GLUCOPHAGE) 1000 MG tablet Take 1 tablet (1,000 mg total) by mouth 2 (two) times daily with a meal.   atorvastatin (LIPITOR) 80 MG tablet Take 1 tablet (80 mg total) by mouth daily.   metFORMIN (GLUCOPHAGE) 1000 MG tablet Take 1 tablet (1,000 mg total) by mouth 2 (two) times daily with a meal.   No facility-administered encounter medications on file as of 04/16/2021.    Follow-up: Return in about 4 months (around 08/14/2021) for Annual physical.   Lindell Spar, MD

## 2021-04-16 NOTE — Assessment & Plan Note (Signed)
BP Readings from Last 1 Encounters:  04/16/21 134/82   Well-controlled with losaran-HCTZ Counseled for compliance with the medications Advised DASH diet and moderate exercise/walking, at least 150 mins/week

## 2021-04-17 LAB — CBC WITH DIFFERENTIAL/PLATELET
Basophils Absolute: 0.1 10*3/uL (ref 0.0–0.2)
Basos: 1 %
EOS (ABSOLUTE): 0.4 10*3/uL (ref 0.0–0.4)
Eos: 6 %
Hematocrit: 50.2 % (ref 37.5–51.0)
Hemoglobin: 17 g/dL (ref 13.0–17.7)
Immature Grans (Abs): 0 10*3/uL (ref 0.0–0.1)
Immature Granulocytes: 0 %
Lymphocytes Absolute: 1.8 10*3/uL (ref 0.7–3.1)
Lymphs: 23 %
MCH: 30.9 pg (ref 26.6–33.0)
MCHC: 33.9 g/dL (ref 31.5–35.7)
MCV: 91 fL (ref 79–97)
Monocytes Absolute: 0.4 10*3/uL (ref 0.1–0.9)
Monocytes: 6 %
Neutrophils Absolute: 4.8 10*3/uL (ref 1.4–7.0)
Neutrophils: 64 %
Platelets: 149 10*3/uL — ABNORMAL LOW (ref 150–450)
RBC: 5.5 x10E6/uL (ref 4.14–5.80)
RDW: 12.1 % (ref 11.6–15.4)
WBC: 7.5 10*3/uL (ref 3.4–10.8)

## 2021-04-17 LAB — VITAMIN D 25 HYDROXY (VIT D DEFICIENCY, FRACTURES): Vit D, 25-Hydroxy: 36.1 ng/mL (ref 30.0–100.0)

## 2021-04-17 LAB — TSH: TSH: 1.71 u[IU]/mL (ref 0.450–4.500)

## 2021-04-17 LAB — LIPID PANEL
Chol/HDL Ratio: 3.4 ratio (ref 0.0–5.0)
Cholesterol, Total: 121 mg/dL (ref 100–199)
HDL: 36 mg/dL — ABNORMAL LOW (ref >39)
LDL Chol Calc (NIH): 69 mg/dL (ref 0–99)
Triglycerides: 78 mg/dL (ref 0–149)
VLDL Cholesterol Cal: 16 mg/dL (ref 5–40)

## 2021-04-17 LAB — HEMOGLOBIN A1C
Est. average glucose Bld gHb Est-mCnc: 186 mg/dL
Hgb A1c MFr Bld: 8.1 % — ABNORMAL HIGH (ref 4.8–5.6)

## 2021-04-17 LAB — CMP14+EGFR
ALT: 39 IU/L (ref 0–44)
AST: 19 IU/L (ref 0–40)
Albumin/Globulin Ratio: 2.2 (ref 1.2–2.2)
Albumin: 4.8 g/dL (ref 3.8–4.9)
Alkaline Phosphatase: 99 IU/L (ref 44–121)
BUN/Creatinine Ratio: 12 (ref 9–20)
BUN: 12 mg/dL (ref 6–24)
Bilirubin Total: 0.4 mg/dL (ref 0.0–1.2)
CO2: 23 mmol/L (ref 20–29)
Calcium: 9.7 mg/dL (ref 8.7–10.2)
Chloride: 104 mmol/L (ref 96–106)
Creatinine, Ser: 1.01 mg/dL (ref 0.76–1.27)
Globulin, Total: 2.2 g/dL (ref 1.5–4.5)
Glucose: 198 mg/dL — ABNORMAL HIGH (ref 70–99)
Potassium: 4.5 mmol/L (ref 3.5–5.2)
Sodium: 141 mmol/L (ref 134–144)
Total Protein: 7 g/dL (ref 6.0–8.5)
eGFR: 88 mL/min/{1.73_m2} (ref >59)

## 2021-05-24 ENCOUNTER — Other Ambulatory Visit: Payer: Self-pay | Admitting: *Deleted

## 2021-05-24 DIAGNOSIS — E1165 Type 2 diabetes mellitus with hyperglycemia: Secondary | ICD-10-CM

## 2021-05-24 MED ORDER — EMPAGLIFLOZIN 10 MG PO TABS: 10.0000 mg | ORAL_TABLET | Freq: Every day | ORAL | 0 refills | Status: DC

## 2021-06-04 ENCOUNTER — Ambulatory Visit: Payer: BC Managed Care – PPO | Admitting: Internal Medicine

## 2021-06-04 ENCOUNTER — Other Ambulatory Visit: Payer: Self-pay

## 2021-06-04 ENCOUNTER — Encounter: Payer: Self-pay | Admitting: Internal Medicine

## 2021-06-04 DIAGNOSIS — U071 COVID-19: Secondary | ICD-10-CM | POA: Diagnosis not present

## 2021-06-04 MED ORDER — MOLNUPIRAVIR EUA 200MG CAPSULE: 4.0000 | ORAL_CAPSULE | Freq: Two times a day (BID) | ORAL | 0 refills | Status: AC

## 2021-06-04 NOTE — Progress Notes (Signed)
Virtual Visit via Telephone Note   This visit type was conducted due to national recommendations for restrictions regarding the COVID-19 Pandemic (e.g. social distancing) in an effort to limit this patient's exposure and mitigate transmission in our community.  Due to his co-morbid illnesses, this patient is at least at moderate risk for complications without adequate follow up.  This format is felt to be most appropriate for this patient at this time.  The patient did not have access to video technology/had technical difficulties with video requiring transitioning to audio format only (telephone).  All issues noted in this document were discussed and addressed.  No physical exam could be performed with this format.  Evaluation Performed:  Follow-up visit  Date:  06/04/2021   ID:  Gavin Harris, DOB 1966-02-14, MRN [id number]  Patient Location: Home Provider Location: Office/Clinic  Participants: Patient Location of Patient: Home Location of Provider: Telehealth Consent was obtain for visit to be over via telehealth. I verified that I am speaking with the correct person using two identifiers.  PCP:  Lindell Spar, MD   Chief Complaint: Cough, sore throat and headache  History of Present Illness:    Gavin Harris is a 56 y.o. male who has a televisit for c/o cough, sore throat and headache since yesterday.  He also has chills and fatigue.  He tested positive for COVID at home.  His girlfriend also tested positive for COVID.  He denies any fever, dyspnea or wheezing.  The patient does have symptoms concerning for COVID-19 infection (fever, chills, cough, or new shortness of breath).   Past Medical, Surgical, Social History, Allergies, and Medications have been Reviewed.  Past Medical History:  Diagnosis Date   Acute ST elevation myocardial infarction (STEMI) ([place]) 02/07/2015   CAD (coronary artery disease) 2015   Stent placement   Diabetes mellitus without complication (HCC)     GERD (gastroesophageal reflux disease)    Hypercholesteremia    Hypertension    My[facility] infarction [facility]) 2014   Past Surgical History:  Procedure Laterality Date   COLONOSCOPY  05/11/2015   Digestive health specialist; tubular adenoma and hyperplastic polyp.   COLONOSCOPY WITH PROPOFOL N/A 08/29/2020   Procedure: COLONOSCOPY WITH PROPOFOL;  Surgeon: Eloise Harman, DO;  [facility];  Service: Endoscopy;  Laterality: N/A;  AM (diabetic)   CORONARY ANGIOPLASTY WITH STENT PLACEMENT  2015   POLYPECTOMY  08/29/2020   Procedure: POLYPECTOMY;  Surgeon: Eloise Harman, DO;  [facility];  Service: Endoscopy;;   tumor in chest removed     at age 67.      Current Meds  Medication Sig   aspirin 81 MG EC tablet Take 81 mg by mouth daily.   atorvastatin (LIPITOR) 80 MG tablet Take 1 tablet (80 mg total) by mouth daily.   empagliflozin (JARDIANCE) 10 MG TABS tablet Take 1 tablet (10 mg total) by mouth daily.   glipiZIDE (GLUCOTROL) 5 MG tablet Take 1 tablet (5 mg total) by mouth daily.   losartan-hydrochlorothiazide (HYZAAR) 100-12.5 MG tablet Take 1 tablet by mouth daily.   metFORMIN (GLUCOPHAGE) 1000 MG tablet Take 1 tablet (1,000 mg total) by mouth 2 (two) times daily with a meal.   Multiple Vitamin (MULTI-VITAMIN) tablet Take 1 tablet by mouth daily.   OVER THE COUNTER MEDICATION Take 3,000 mg by mouth daily. Nugenix Ultimate   tadalafil (CIALIS) 20 MG tablet TAKE 1 TABLET BY MOUTH AS NEEDED FOR ERECTILE DYSFUNCTION  Allergies:   Penicillins   ROS:   Please see the history of present illness.     All other systems reviewed and are negative.   Labs/Other Tests and Data Reviewed:    Recent Labs: 04/16/2021: ALT 39; BUN 12; Creatinine, Ser 1.01; Hemoglobin 17.0; Platelets 149; Potassium 4.5; Sodium 141; TSH 1.710   Recent Lipid Panel Lab Results  Component Value Date/Time   CHOL 121 04/16/2021 08:41 AM   TRIG 78 04/16/2021 08:41 AM   HDL 36 (L)  04/16/2021 08:41 AM   CHOLHDL 3.4 04/16/2021 08:41 AM   CHOLHDL 3.0 10/30/2020 09:09 AM   LDLCALC 69 04/16/2021 08:41 AM   LDLCALC 61 10/30/2020 09:09 AM    Wt Readings from Last 3 Encounters:  04/16/21 227 lb 1.3 oz (103 kg)  02/06/21 225 lb 12.8 oz (102.4 kg)  10/30/20 224 lb (101.6 kg)     ASSESSMENT & PLAN:    COVID-19 infection Started Molnupiravir as no availability of Paxlovid Advised to take Mucinex or Robitussin as needed for cough Advised to self quarantine for additional 4 days or at least 24-hour afebrile period, whichever is later Work note provided   Time:   Today, I have spent 9 minutes reviewing the chart, including problem list, medications, and with the patient with telehealth technology discussing the above problems.   Medication Adjustments/Labs and Tests Ordered: Current medicines are reviewed at length with the patient today.  Concerns regarding medicines are outlined above.   Tests Ordered: No orders of the defined types were placed in this encounter.   Medication Changes: No orders of the defined types were placed in this encounter.    Note: This dictation was prepared with Dragon dictation along with smaller phrase technology. Similar sounding words can be transcribed inadequately or may not be corrected upon review. Any transcriptional errors that result from this process are unintentional.      Disposition:  Follow up  Signed, Lindell Spar, MD  06/04/2021 3:52 PM     [facility]

## 2021-06-21 ENCOUNTER — Other Ambulatory Visit: Payer: Self-pay | Admitting: *Deleted

## 2021-06-21 MED ORDER — GLIPIZIDE 5 MG PO TABS: 5.0000 mg | ORAL_TABLET | Freq: Every day | ORAL | 0 refills | Status: DC

## 2021-07-18 ENCOUNTER — Encounter: Payer: Self-pay | Admitting: *Deleted

## 2021-08-03 ENCOUNTER — Telehealth: Payer: Self-pay

## 2021-08-03 ENCOUNTER — Telehealth: Payer: Self-pay | Admitting: Internal Medicine

## 2021-08-03 NOTE — Telephone Encounter (Signed)
Patient called needs blood pressure med refill. Has no more left after today ?losartan-hydrochlorothiazide (HYZAAR) 100-12.5 MG tablet ? ?Pharmacy: [facility] ?

## 2021-08-03 NOTE — Telephone Encounter (Signed)
Patient called in regard to last tele  ? ?Needs refill on  ?losartan-hydrochlorothiazide (HYZAAR) 100-12.5 MG tablet ?

## 2021-08-06 ENCOUNTER — Other Ambulatory Visit: Payer: Self-pay

## 2021-08-06 DIAGNOSIS — I10 Essential (primary) hypertension: Secondary | ICD-10-CM

## 2021-08-06 MED ORDER — LOSARTAN POTASSIUM-HCTZ 100-12.5 MG PO TABS: 1.0000 | ORAL_TABLET | Freq: Every day | ORAL | 0 refills | Status: DC

## 2021-08-06 NOTE — Telephone Encounter (Signed)
Refills sent

## 2021-08-07 ENCOUNTER — Ambulatory Visit: Payer: BC Managed Care – PPO | Admitting: Cardiology

## 2021-08-07 ENCOUNTER — Encounter: Payer: Self-pay | Admitting: Cardiology

## 2021-08-07 ENCOUNTER — Other Ambulatory Visit: Payer: Self-pay

## 2021-08-07 VITALS — BP 120/80 | HR 90 | Ht 69.0 in | Wt 221.2 lb

## 2021-08-07 DIAGNOSIS — I10 Essential (primary) hypertension: Secondary | ICD-10-CM

## 2021-08-07 DIAGNOSIS — E782 Mixed hyperlipidemia: Secondary | ICD-10-CM

## 2021-08-07 DIAGNOSIS — I2510 Atherosclerotic heart disease of native coronary artery without angina pectoris: Secondary | ICD-10-CM

## 2021-08-07 MED ORDER — ROSUVASTATIN CALCIUM 40 MG PO TABS: 40.0000 mg | ORAL_TABLET | Freq: Every day | ORAL | 3 refills | Status: DC

## 2021-08-07 NOTE — Progress Notes (Signed)
? ? ? ?Clinical Summary ?Mr. Hemmer is a 56 y.o.male seen today for follow up of the following medical problems.  ?  ?Previously followed by [facility] cardiology ?  ?  ?1. CAD ?- history of STEMI in 2016, received DES to RCA. He had moderate nonobstructive disease in the LAD. He had mildly reduced LV function with an EF of 45-50% by echo in September 2016.  ?12/2018 neg stress echo ?  ?-no chest pains, no SOB/DOE  ?- compliant with meds ?  ?2. HTN ?- compliant with meds ?  ?  ?3. Hyperlipidemia ?10/2019 TC 11 HDL 32 TG 157 LDL 56 ?10/2020 TC 118 HDL 40 TG 83 LDL 61 ?  ?03/2021 TC 121 TG 78 HDL 36 LDL 69 ?  ?4. DM2 ?- last A1c 7.9 ?- followed by pcp ?  ?  ?  ?  ?SH: works as Administrator, [employer] ?  ? ? ?Past Medical History:  ?Diagnosis Date  ? Acute ST elevation myocardial infarction (STEMI) ([place]) 02/07/2015  ? CAD (coronary artery disease) 2015  ? Stent placement  ? Diabetes mellitus without complication ([place])   ? GERD (gastroesophageal reflux disease)   ? Hypercholesteremia   ? Hypertension   ? Myocardial infarction [facility]) 2014  ? ? ? ?Allergies  ?Allergen Reactions  ? Penicillins Hives and Other (See Comments)  ?  Mother told him ?  ? ? ? ?Current Outpatient Medications  ?Medication Sig Dispense Refill  ? aspirin 81 MG EC tablet Take 81 mg by mouth daily.    ? atorvastatin (LIPITOR) 80 MG tablet Take 1 tablet (80 mg total) by mouth daily. 90 tablet 1  ? empagliflozin (JARDIANCE) 10 MG TABS tablet Take 1 tablet (10 mg total) by mouth daily. 90 tablet 0  ? glipiZIDE (GLUCOTROL) 5 MG tablet Take 1 tablet (5 mg total) by mouth daily. 90 tablet 0  ? losartan-hydrochlorothiazide (HYZAAR) 100-12.5 MG tablet Take 1 tablet by mouth daily. 90 tablet 0  ? metFORMIN (GLUCOPHAGE) 1000 MG tablet Take 1 tablet (1,000 mg total) by mouth 2 (two) times daily with a meal. 180 tablet 1  ? Multiple Vitamin (MULTI-VITAMIN) tablet Take 1 tablet by mouth daily.    ? OVER THE COUNTER MEDICATION Take 3,000 mg by mouth daily.  Nugenix Ultimate    ? tadalafil (CIALIS) 20 MG tablet TAKE 1 TABLET BY MOUTH AS NEEDED FOR ERECTILE DYSFUNCTION 20 tablet 11  ? ?No current facility-administered medications for this visit.  ? ? ? ?Past Surgical History:  ?Procedure Laterality Date  ? COLONOSCOPY  05/11/2015  ? Digestive health specialist; tubular adenoma and hyperplastic polyp.  ? COLONOSCOPY WITH PROPOFOL N/A 08/29/2020  ? Procedure: COLONOSCOPY WITH PROPOFOL;  Surgeon: Eloise Harman, DO;  [facility];  Service: Endoscopy;  Laterality: N/A;  AM (diabetic)  ? CORONARY ANGIOPLASTY WITH STENT PLACEMENT  2015  ? POLYPECTOMY  08/29/2020  ? Procedure: POLYPECTOMY;  Surgeon: Eloise Harman, DO;  [facility];  Service: Endoscopy;;  ? tumor in chest removed    ? at age 16.   ? ? ? ?Allergies  ?Allergen Reactions  ? Penicillins Hives and Other (See Comments)  ?  Mother told him ?  ? ? ? ? ?Family History  ?Problem Relation Age of Onset  ? Breast cancer Mother   ? Lung cancer Father   ? Colon cancer Neg Hx   ? ? ? ?Social History ?Mr. Allie reports that he quit  smoking about 3 months ago. His smoking use included cigarettes. He has a 22.50 pack-year smoking history. He has never used smokeless tobacco. ?Mr. Curnutt reports that he does not currently use alcohol. ? ? ?Review of Systems ?CONSTITUTIONAL: No weight loss, fever, chills, weakness or fatigue.  ?HEENT: Eyes: No visual loss, blurred vision, double vision or yellow sclerae.No hearing loss, sneezing, congestion, runny nose or sore throat.  ?SKIN: No rash or itching.  ?CARDIOVASCULAR: per hpi ?RESPIRATORY: No shortness of breath, cough or sputum.  ?GASTROINTESTINAL: No anorexia, nausea, vomiting or diarrhea. No abdominal pain or blood.  ?GENITOURINARY: No burning on urination, no polyuria ?NEUROLOGICAL: No headache, dizziness, syncope, paralysis, ataxia, numbness or tingling in the extremities. No change in bowel or bladder control.  ?MUSCULOSKELETAL: No muscle, back pain, joint  pain or stiffness.  ?LYMPHATICS: No enlarged nodes. No history of splenectomy.  ?PSYCHIATRIC: No history of depression or anxiety.  ?ENDOCRINOLOGIC: No reports of sweating, cold or heat intolerance. No polyuria or polydipsia.  ?. ? ? ?Physical Examination ?Today's Vitals  ? 08/07/21 0829  ?BP: 120/80  ?Pulse: 90  ?SpO2: 97%  ?Weight: 221 lb 3.2 oz (100.3 kg)  ?Height: '5\' 9"'$  (1.753 m)  ? ?Body mass index is 32.67 kg/m?. ? ?Gen: resting comfortably, no acute distress ?HEENT: no scleral icterus, pupils equal round and reactive, no palptable cervical adenopathy,  ?CV: RRR, no m/r/g no jvd ?Resp: Clear to auscultation bilaterally ?GI: abdomen is soft, non-tender, non-distended, normal bowel sounds, no hepatosplenomegaly ?MSK: extremities are warm, no edema.  ?Skin: warm, no rash ?Neuro:  no focal deficits ?Psych: appropriate affect ? ? ?Diagnostic Studies ? ?12/2018 stress [place] ?The patient had no chest pain. ?The patient achieved 89 % of maximum predicted heart rate. ?The METS achieved was 8. ?Exercise capacity was average. ?LV function mildly decreased at rest: 45%. ?There is inferior wall and inferolateral wall hypokinesis ?No augmentation of this wall with stress ?Negative stress ECG for inducible ischemia at target heart rate. ?No new wall motion with stress ?The stress is negative for ischemia and shows prior RCA territory infarction ?- ?FINDINGS: ?- ?ECG REST ?The baseline ECG displays normal sinus rhythm. RBBB. Baseline ECG showed  ?inferior myocardial infarction. ?- ?ECG STRESS ?No diagnostic ST segment changes were seen. Negative stress ECG for inducible  ?ischemia at target heart rate. ECG changes, blood pressure and heart rate  ?responses during stress test are shown in the Cardiology Scan portion of this  ?report in the EPIC. ?- ?REST ECHO ?LV function mildly decreased at rest ?45%. ?There is inferior wall and inferolateral wall hypokinesis ?No augmentation of this wall with stress. ?- ?STRESS  ECHO ?There were no segmental wall motion abnormalities post exercise. The  ?estimated LV ejection fraction is 55-60% with stress. Negative exercise  ?echocardiography for inducible ischemia at target heart rate. ?  ?01/2015 Echo [place]  ?The left ventricular size is normal.  ? ?Left ventricular systolic function is mildly reduced.  ?LV ejection fraction = 45-50%.  ? ?Regional wall motion abnormalities as specified below.  ?The right ventricle is normal in size and function.  ?The left atrial size is normal.  ?Structurally normal aortic valve.  ?There is no aortic regurgitation.  ?The mitral valve leaflets appear normal.  ?There is trace mitral regurgitation.  ?Structurally normal tricuspid valve.  ?There is trace tricuspid regurgitation.  ?Structurally normal pulmonic valve.  ?There is no pulmonic valvular regurgitation.  ?There is no pericardial effusion.  ?There is no comparison study  available.  ?  ?  ?01/2015 Cath [facility] ?OTHER:  Patient underwent successful primary PCI of RCA. LAD has several  ?50-60% proximal to mid stenoses. LCX has diffuse disease and small. RCA is a  ?large vessel with 100% thrombotic occlusion. RCA lesion was stented with a  ?3.5X70m Premier followed by post-dilation with 4.08mNC balloon. There is 0%  ?residual stenosis and TIMI III flow. Thrombectomy with Export catheter was  ?performed. Intra-coronary ReoPro was used. Patient will be on IV ReoPro for  ?12 hrs. There is no procedural complication. Loss of blood is minimal. 11  ?  ? ? ?Assessment and Plan  ? ?1. CAD ?- no symptoms, continue current meds ?- EKG today SR, chronic RBBB, no acute ischemic changes ?  ?2. HTN ?- at goal, continue current meds ?  ?  ?3. Hyperlipidemia ?-prior MI with multiple high risk factors, would set goal LDL <55 ?- change lipitor to crestor '40mg'$  daily.  ? ? ?F/u 6 months ? ?JoArnoldo LenisM.D. ?

## 2021-08-07 NOTE — Patient Instructions (Signed)
Medication Instructions:  ?Your physician has recommended you make the following change in your medication:  ?Stop atorvastatin ?Start rosuvastatin 40 mg once a day ?Continue other medications as directed ? ?Labwork: ?none ? ?Testing/Procedures: ?none ? ?Follow-Up: ?Your physician recommends that you schedule a follow-up appointment in: 6 months ? ?Any Other Special Instructions Will Be Listed Below (If Applicable). ? ?If you need a refill on your cardiac medications before your next appointment, please call your pharmacy. ? ?

## 2021-08-14 ENCOUNTER — Encounter: Payer: BC Managed Care – PPO | Admitting: Internal Medicine

## 2021-08-24 ENCOUNTER — Encounter: Payer: Self-pay | Admitting: Internal Medicine

## 2021-08-24 ENCOUNTER — Ambulatory Visit (INDEPENDENT_AMBULATORY_CARE_PROVIDER_SITE_OTHER): Payer: BC Managed Care – PPO | Admitting: Internal Medicine

## 2021-08-24 VITALS — BP 132/82 | HR 92 | Resp 16 | Ht 69.0 in | Wt 216.0 lb

## 2021-08-24 DIAGNOSIS — H9193 Unspecified hearing loss, bilateral: Secondary | ICD-10-CM

## 2021-08-24 DIAGNOSIS — Z0001 Encounter for general adult medical examination with abnormal findings: Secondary | ICD-10-CM | POA: Diagnosis not present

## 2021-08-24 DIAGNOSIS — Z720 Tobacco use: Secondary | ICD-10-CM

## 2021-08-24 DIAGNOSIS — H903 Sensorineural hearing loss, bilateral: Secondary | ICD-10-CM

## 2021-08-24 DIAGNOSIS — D229 Melanocytic nevi, unspecified: Secondary | ICD-10-CM

## 2021-08-24 DIAGNOSIS — Z125 Encounter for screening for malignant neoplasm of prostate: Secondary | ICD-10-CM

## 2021-08-24 DIAGNOSIS — Z23 Encounter for immunization: Secondary | ICD-10-CM | POA: Diagnosis not present

## 2021-08-24 DIAGNOSIS — I10 Essential (primary) hypertension: Secondary | ICD-10-CM

## 2021-08-24 NOTE — Assessment & Plan Note (Signed)
Ordered PSA after discussing its limitations for prostate cancer screening, including false positive results leading to additional investigations. 

## 2021-08-24 NOTE — Addendum Note (Signed)
Addended byIhor Dow on: 08/24/2021 12:17 PM ? ? Modules accepted: Level of Service ? ?

## 2021-08-24 NOTE — Assessment & Plan Note (Signed)
Benign lesion ?Advised patient to report changes in the lesion, including asymmetry, irregular borders, color variation, and diameter greater than 65m.  ? ? ?

## 2021-08-24 NOTE — Assessment & Plan Note (Addendum)
Smokes about 0.5 pack/day  Asked about quitting: confirms that he/she currently smokes cigarettes Advise to quit smoking: Educated about QUITTING to reduce the risk of cancer, cardio and cerebrovascular disease. Assess willingness: Unwilling to quit at this time, but is working on cutting back. Assist with counseling and pharmacotherapy: Counseled for 5 minutes and literature provided. Arrange for follow up: follow up in 3 months and continue to offer help. 

## 2021-08-24 NOTE — Assessment & Plan Note (Signed)
The patient experiences decreased hearing with the hearing aid in place.  ?Referral to audiology made today.  ?

## 2021-08-24 NOTE — Assessment & Plan Note (Signed)
Controlled on the antihypertensive regiment ?BP today is 132/82 ?

## 2021-08-24 NOTE — Progress Notes (Addendum)
? ?Established Patient Office Visit ? ?Subjective:  ?Patient ID: Gavin Harris, male    DOB: 07-11-1965  Age: 56 y.o. MRN: [id number] ? ?CC:  ?Chief Complaint  ?Patient presents with  ? Annual Exam  ?  Annual exam   ? ? ?HPI ?Gavin Harris is a 56 y.o. male with a past medical history of CAD s/p stent placement, HTN, GERD, type II DM, tobacco abuse, and obesity who presents for an annual exam. The patient requested a referral to the audiologist to examine his ears and a prescription for hearing aids because his current hearing aid has not been working correctly. His job requires him to be examined for his hearing aid's proper fit and placement.  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  ? 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  ? ?Gavin Harris states that his partner requested that he see a dermatologist for his back rash; he sees Dr. Jacelyn Pi at [facility] and denies drinking. ?Past Medical History:  ?Diagnosis Date  ? Acute ST elevation myocardial infarction (STEMI) ([place]) 02/07/2015  ? CAD (coronary artery disease) 2015  ? Stent placement  ? Diabetes mellitus without complication ([place])   ? GERD (gastroesophageal reflux disease)   ? Hypercholesteremia   ? Hypertension   ? Myocardial infarction [facility]) 2014  ? ? ?Past Surgical History:  ?Procedure Laterality Date  ? COLONOSCOPY  05/11/2015  ? [facility]est[facility] health specialist; tubular adenoma and hyperplastic polyp.  ? COLONOSCOPY WITH PROPOFOL N/A 08/29/2020  ? Procedure: COLONOSCOPY WITH PROPOFOL;  Surgeon: Eloise Harman, DO;  Location: [facility];  Service: Endoscopy;  Laterality: N/A;  AM (diabetic)  ? CORONARY ANGIOPLASTY WITH STENT PLACEMENT  2015  ? POLYPECTOMY  08/29/2020  ? Procedure: POLYPECTOMY;  Surgeon: Eloise Harman, DO;  Location: [facility];  Service: Endoscopy;;  ? tumor in chest removed    ? at age 46.   ? ? ?Family History  ?Problem Relation Age of Onset  ? Breast cancer Mother   ? Lung cancer Father   ? Colon cancer Neg Hx   ? ? ?Social History  ? ?Socioeconomic History  ? Marital status: Single  ?  Spouse name: Not on file  ? Number of children: 3  ? Years of education: Not on file  ? Highest education level: Not on file  ?Occupational History  ? Occupation: truck Geophysicist/field seismologist  ?Tobacco Use  ? Smoking status: Some Days  ?  Packs/day: 0.75  ?  Years: 30.00  ?  Pack years: 22.50  ?  Types: Cigarettes  ?  Last attempt to quit: 04/14/2021  ?  Years since quitting: 0.3  ? Smokeless tobacco: Never  ?Vaping Use  ? Vaping Use: Never used  ?Substance and Sexual Activity  ? Alcohol use: Not Currently  ?  Comment: Quit 3 years ago.  (06/16/2020)  ? Drug use: Not Currently  ?  Types: Marijuana, Cocaine  ?  Comment: 30 years ago.   ? Sexual activity: Yes  ?Other Topics Concern  ? Not on file  ?Social History Narrative  ? 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  ? ?Social Determinants of Health  ? ?Financial Resource Strain: Not on file  ?Food Insecurity: Not on file  ?  Transportation Needs: Not on file  ?Physical Activity: Not on file  ?Stress: Not on file  ?Social Connections: Not on file  ?Intimate Partner Violence: Not on file  ? ? ?Outpatient Medications Prior to Visit  ?Medication Sig Dispense Refill  ? aspirin 81 MG EC tablet Take 81 mg by mouth daily.    ? empagliflozin (JARDIANCE) 10 MG TABS tablet Take 1 tablet (10 mg total) by mouth daily. 90 tablet 0  ? glipiZIDE (GLUCOTROL) 5 MG tablet Take 1 tablet (5 mg total) by mouth daily. 90 tablet 0  ? losartan-hydrochlorothiazide (HYZAAR) 100-12.5 MG tablet Take 1 tablet by mouth daily. 90 tablet 0  ? metFORMIN (GLUCOPHAGE) 1000 MG tablet Take 1 tablet (1,000 mg total) by mouth 2 (two) times daily with a meal. 180 tablet 1  ? Multiple Vitamin  (MULTI-VITAMIN) tablet Take 1 tablet by mouth daily.    ? OVER THE COUNTER MEDICATION Take 3,000 mg by mouth daily. Nugenix Ultimate    ? rosuvastatin (CRESTOR) 40 MG tablet Take 1 tablet (40 mg total) by mouth daily. 90 tablet 3  ? tadalafil (CIALIS) 20 MG tablet TAKE 1 TABLET BY MOUTH AS NEEDED FOR ERECTILE DYSFUNCTION 20 tablet 11  ? ?No facility-administered medications prior to visit.  ? ? ?Allergies  ?Allergen Reactions  ? Penicillins Hives and Other (See Comments)  ?  Mother told him ?  ? ? ?ROS ?Review of Systems  ?Constitutional:  Negative for chills, fatigue and fever.  ?HENT:  Positive for hearing loss. Negative for sore throat and tinnitus.   ?Eyes:  Negative for photophobia and visual disturbance.  ?Respiratory:  Negative for choking, chest tightness and shortness of breath.   ?Cardiovascular:  Negative for chest pain, palpitations and leg swelling.  ?Gastrointestinal:  Negative for constipation and diarrhea.  ?Endocrine: Negative for polydipsia, polyphagia and polyuria.  ?Genitourinary:  Negative for difficulty urinating, dysuria, penile pain and penile swelling.  ?Musculoskeletal:  Negative for arthralgias, back pain and joint swelling.  ?Skin:  Positive for rash.  ?Allergic/Immunologic: Negative for food allergies.  ?Neurological:  Negative for dizziness, weakness, light-headedness, numbness and headaches.  ?Hematological:  Does not bruise/bleed easily.  ?Psychiatric/Behavioral:  Negative for agitation, behavioral problems, sleep disturbance and suicidal ideas.   ? ?  ?Objective:  ?  ?Physical Exam ?Constitutional:   ?   Appearance: Normal appearance.  ?HENT:  ?   Head: Normocephalic and atraumatic.  ?   Right Ear: Ear canal normal.  ?   Left Ear: Ear canal normal.  ?   Ears:  ?   Comments: Rinne test -AC>BC ?   Nose: No congestion or rhinorrhea.  ?   Mouth/Throat:  ?   Mouth: Mucous membranes are moist.  ?   Pharynx: Oropharynx is clear.  ?Eyes:  ?   General: No scleral icterus.    ?   Right eye: No  discharge.     ?   Left eye: No discharge.  ?   Extraocular Movements: Extraocular movements intact.  ?Cardiovascular:  ?   Rate and Rhythm: Normal rate and regular rhythm.  ?   Pulses: Normal pulses.  ?   Heart sounds: Normal heart sounds. No murmur heard. ?Pulmonary:  ?   Breath sounds: Normal breath sounds. No wheezing or rales.  ?Abdominal:  ?   General: There is no distension.  ?   Tenderness: There is no abdominal tenderness.  ?Musculoskeletal:     ?   General: No tenderness or deformity.  ?  Cervical back: No rigidity or tenderness.  ?   Right lower leg: No edema.  ?   Left lower leg: No edema.  ?Skin: ?   Capillary Refill: Capillary refill takes less than 2 seconds.  ?   Findings: Lesion present. No bruising or rash.  ?   Comments: Nevus noted on the upper back - purplish, about 0.5 cm in diameter  ?Neurological:  ?   Mental Status: He is alert.  ?   Motor: No weakness.  ?Psychiatric:     ?   Mood and Affect: Mood normal.     ?   Behavior: Behavior normal.  ? ? ?BP 132/82 (BP Location: Left Arm, Patient Position: Sitting, Cuff Size: Normal)   Pulse 92   Resp 16   Ht _0  (1.753 m)   Wt 216 lb (98 kg)   SpO2 97%   BMI 31.90 kg/m?  ?Wt Readings from Last 3 Encounters:  ?08/24/21 216 lb (98 kg)  ?08/07/21 221 lb 3.2 oz (100.3 kg)  ?04/16/21 227 lb 1.3 oz (103 kg)  ? ? ? ?Health Maintenance Due  ?Topic Date Due  ? COVID-19 Vaccine (1) Never done  ? ? ?There are no preventive care reminders to display for this patient. ? ?Lab Results  ?Component Value Date  ? TSH 1.710 04/16/2021  ? ?Lab Results  ?Component Value Date  ? WBC 7.5 04/16/2021  ? HGB 17.0 04/16/2021  ? HCT 50.2 04/16/2021  ? MCV 91 04/16/2021  ? PLT 149 (L) 04/16/2021  ? ?Lab Results  ?Component Value Date  ? NA 141 04/16/2021  ? K 4.5 04/16/2021  ? CO2 23 04/16/2021  ? GLUCOSE 198 (H) 04/16/2021  ? BUN 12 04/16/2021  ? CREATININE 1.01 04/16/2021  ? BILITOT 0.4 04/16/2021  ? ALKPHOS 99 04/16/2021  ? AST 19 04/16/2021  ? ALT 39 04/16/2021  ?  PROT 7.0 04/16/2021  ? ALBUMIN 4.8 04/16/2021  ? CALCIUM 9.7 04/16/2021  ? ANIONGAP 11 08/25/2020  ? EGFR 88 04/16/2021  ? ?Lab Results  ?Component Value Date  ? CHOL 121 04/16/2021  ? ?Lab Results  ?Component Value Dat

## 2021-08-24 NOTE — Patient Instructions (Addendum)
I appreciate the opportunity to provide you with care for your health and wellness. ?Today we discussed: Healthy habits ?You received your first dose of shingles vaccine today. ?  ?Follow up: 1 year ? Labs: TSH, blood work, electrolytes, Blood sugar, Vit D levels, PSA levels  and your cholesterol levels ?Referrals today- [facility] ? ?  ?Please continue to maintain a healthy weight and diet and smoking cessation  ? ? ?  ?It was a pleasure to see you and I look forward to continuing to work together on your health and well-being. ?Please do not hesitate to call the office if you need care or have questions about your care. ?  ?Have a wonderful day and week. ?With Gratitude, ?Alvira Monday MSN, FNP-BC  ? ? ? ? ? ? ? ?Health Maintenance, Male ?Adopting a healthy lifestyle and getting preventive care are important in promoting health and wellness. Ask your health care provider about: ?The right schedule for you to have regular tests and exams. ?Things you can do on your own to prevent diseases and keep yourself healthy. ?What should I know about diet, weight, and exercise? ?Eat a healthy diet ? ?Eat a diet that includes plenty of vegetables, fruits, low-fat dairy products, and lean protein. ?Do not eat a lot of foods that are high in solid fats, added sugars, or sodium. ?Maintain a healthy weight ?Body mass index (BMI) is a measurement that can be used to identify possible weight problems. It estimates body fat based on height and weight. Your health care provider can help determine your BMI and help you achieve or maintain a healthy weight. ?Get regular exercise ?Get regular exercise. This is one of the most important things you can do for your health. Most adults should: ?Exercise for at least 150 minutes each week. The exercise should increase your heart rate and make you sweat (moderate-intensity exercise). ?Do strengthening exercises at least twice a week. This is in addition to the  moderate-intensity exercise. ?Spend less time sitting. Even light physical activity can be beneficial. ?Watch cholesterol and blood lipids ?Have your blood tested for lipids and cholesterol at 56 years of age, then have this test every 5 years. ?You may need to have your cholesterol levels checked more often if: ?Your lipid or cholesterol levels are high. ?You are older than 56 years of age. ?You are at high risk for heart disease. ?What should I know about cancer screening? ?Many types of cancers can be detected early and may often be prevented. Depending on your health history and family history, you may need to have cancer screening at various ages. This may include screening for: ?Colorectal cancer. ?Prostate cancer. ?Skin cancer. ?Lung cancer. ?What should I know about heart disease, diabetes, and high blood pressure? ?Blood pressure and heart disease ?High blood pressure causes heart disease and increases the risk of stroke. This is more likely to develop in people who have high blood pressure readings or are overweight. ?Talk with your health care provider about your target blood pressure readings. ?Have your blood pressure checked: ?Every 3-5 years if you are 63-65 years of age. ?Every year if you are 38 years old or older. ?If you are between the ages of 51 and 46 and are a current or former smoker, ask your health care provider if you should have a one-time screening for abdominal aortic aneurysm (AAA). ?Diabetes ?Have regular diabetes screenings. This checks your fasting blood sugar level. Have the screening done: ?Once every three  years after age 66 if you are at a normal weight and have a low risk for diabetes. ?More often and at a younger age if you are overweight or have a high risk for diabetes. ?What should I know about preventing infection? ?Hepatitis B ?If you have a higher risk for hepatitis B, you should be screened for this virus. Talk with your health care provider to find out if you are at  risk for hepatitis B infection. ?Hepatitis C ?Blood testing is recommended for: ?Everyone born from [birth year] through 1965. ?Anyone with known risk factors for hepatitis C. ?Sexually transmitted infections (STIs) ?You should be screened each year for STIs, including gonorrhea and chlamydia, if: ?You are sexually active and are younger than 56 years of age. ?You are older than 56 years of age and your health care provider tells you that you are at risk for this type of infection. ?Your sexual activity has changed since you were last screened, and you are at increased risk for chlamydia or gonorrhea. Ask your health care provider if you are at risk. ?Ask your health care provider about whether you are at high risk for HIV. Your health care provider may recommend a prescription medicine to help prevent HIV infection. If you choose to take medicine to prevent HIV, you should first get tested for HIV. You should then be tested every 3 months for as long as you are taking the medicine. ?Follow these instructions at home: ?Alcohol use ?Do not drink alcohol if your health care provider tells you not to drink. ?If you drink alcohol: ?Limit how much you have to 0-2 drinks a day. ?Know how much alcohol is in your drink. In the [place] one drink equals one 12 oz bottle of beer (355 mL), one 5 oz glass of wine (148 mL), or one 1? oz glass of hard liquor (44 mL). ?Lifestyle ?Do not use any products that contain nicotine or tobacco. These products include cigarettes, chewing tobacco, and vaping devices, such as e-cigarettes. If you need help quitting, ask your health care provider. ?Do not use street drugs. ?Do not share needles. ?Ask your health care provider for help if you need support or information about quitting drugs. ?General instructions ?Schedule regular health, dental, and eye exams. ?Stay current with your vaccines. ?Tell your health care provider if: ?You often feel depressed. ?You have ever been abused or do not feel safe  at home. ?Summary ?Adopting a healthy lifestyle and getting preventive care are important in promoting health and wellness. ?Follow your health care provider's instructions about healthy diet, exercising, and getting tested or screened for diseases. ?Follow your health care provider's instructions on monitoring your cholesterol and blood pressure. ?This information is not intended to replace advice given to you by your health care provider. Make sure you discuss any questions you have with your health care provider. ?Document Revised: 10/02/2020 Document Reviewed: 10/02/2020 ?Elsevier Patient Education ? 2022 [place]. ? ?   Why follow it? Research shows? ?Those who follow the Mediterranean diet have a reduced risk of heart disease  ?The diet is associated with a reduced incidence of Parkinson's and Alzheimer's diseases ?People following the diet may have longer life expectancies and lower rates of chronic diseases  ?The Dietary Guidelines for Americans recommends the Mediterranean diet as an eating plan to promote health and prevent disease ? ?What Is the Mediterranean Diet?  ?Healthy eating plan based on typical foods and recipes of Mediterranean-style cooking ?The diet is primarily a plant  based diet; these foods should make up a majority of meals  ? ?Starches - Plant based foods should make up a majority of meals ?- They are an important sources of vitamins, minerals, energy, antioxidants, and fiber ?- Choose whole grains, foods high in fiber and minimally processed items  ?- Typical grain sources include wheat, oats, barley, corn, brown rice, bulgar, farro, millet, polenta, couscous  ?- Various types of beans include chickpeas, lentils, fava beans, black beans, white beans   ?Fruits  Veggies - Large quantities of antioxidant rich fruits & veggies; 6 or more servings  ?- Vegetables can be eaten raw or lightly drizzled with oil and cooked  ?- Vegetables common to the traditional Mediterranean Diet include:  artichokes, arugula, beets, broccoli, brussel sprouts, cabbage, carrots, celery, collard greens, cucumbers, eggplant, kale, leeks, lemons, lettuce, mushrooms, okra, onions, peas, peppers, potatoes, pumpkin, radishes,

## 2021-08-24 NOTE — Assessment & Plan Note (Signed)

## 2021-08-25 ENCOUNTER — Other Ambulatory Visit: Payer: Self-pay | Admitting: Internal Medicine

## 2021-08-25 DIAGNOSIS — E1165 Type 2 diabetes mellitus with hyperglycemia: Secondary | ICD-10-CM

## 2021-08-25 LAB — HEMOGLOBIN A1C
Est. average glucose Bld gHb Est-mCnc: 189 mg/dL
Hgb A1c MFr Bld: 8.2 % — ABNORMAL HIGH (ref 4.8–5.6)

## 2021-08-25 LAB — COMPREHENSIVE METABOLIC PANEL
ALT: 34 IU/L (ref 0–44)
AST: 22 IU/L (ref 0–40)
Albumin/Globulin Ratio: 1.8 (ref 1.2–2.2)
Albumin: 4.7 g/dL (ref 3.8–4.9)
Alkaline Phosphatase: 98 IU/L (ref 44–121)
BUN/Creatinine Ratio: 12 (ref 9–20)
BUN: 12 mg/dL (ref 6–24)
Bilirubin Total: 0.6 mg/dL (ref 0.0–1.2)
CO2: 26 mmol/L (ref 20–29)
Calcium: 10.5 mg/dL — ABNORMAL HIGH (ref 8.7–10.2)
Chloride: 102 mmol/L (ref 96–106)
Creatinine, Ser: 1.04 mg/dL (ref 0.76–1.27)
Globulin, Total: 2.6 g/dL (ref 1.5–4.5)
Glucose: 167 mg/dL — ABNORMAL HIGH (ref 70–99)
Potassium: 4.9 mmol/L (ref 3.5–5.2)
Sodium: 142 mmol/L (ref 134–144)
Total Protein: 7.3 g/dL (ref 6.0–8.5)
eGFR: 85 mL/min/{1.73_m2} (ref >59)

## 2021-08-25 LAB — TSH+FREE T4
Free T4: 1.33 ng/dL (ref 0.82–1.77)
TSH: 1.16 u[IU]/mL (ref 0.450–4.500)

## 2021-08-25 LAB — CBC WITH DIFFERENTIAL/PLATELET
Basophils Absolute: 0.1 10*3/uL (ref 0.0–0.2)
Basos: 1 %
EOS (ABSOLUTE): 0.3 10*3/uL (ref 0.0–0.4)
Eos: 5 %
Hematocrit: 50.6 % (ref 37.5–51.0)
Hemoglobin: 17.2 g/dL (ref 13.0–17.7)
Immature Grans (Abs): 0 10*3/uL (ref 0.0–0.1)
Immature Granulocytes: 0 %
Lymphocytes Absolute: 1.5 10*3/uL (ref 0.7–3.1)
Lymphs: 21 %
MCH: 31 pg (ref 26.6–33.0)
MCHC: 34 g/dL (ref 31.5–35.7)
MCV: 91 fL (ref 79–97)
Monocytes Absolute: 0.5 10*3/uL (ref 0.1–0.9)
Monocytes: 7 %
Neutrophils Absolute: 4.5 10*3/uL (ref 1.4–7.0)
Neutrophils: 66 %
Platelets: 139 10*3/uL — ABNORMAL LOW (ref 150–450)
RBC: 5.55 x10E6/uL (ref 4.14–5.80)
RDW: 12.4 % (ref 11.6–15.4)
WBC: 6.8 10*3/uL (ref 3.4–10.8)

## 2021-08-25 LAB — LIPID PANEL
Chol/HDL Ratio: 3.1 ratio (ref 0.0–5.0)
Cholesterol, Total: 96 mg/dL — ABNORMAL LOW (ref 100–199)
HDL: 31 mg/dL — ABNORMAL LOW (ref >39)
LDL Chol Calc (NIH): 46 mg/dL (ref 0–99)
Triglycerides: 95 mg/dL (ref 0–149)
VLDL Cholesterol Cal: 19 mg/dL (ref 5–40)

## 2021-08-25 LAB — VITAMIN D 25 HYDROXY (VIT D DEFICIENCY, FRACTURES): Vit D, 25-Hydroxy: 45 ng/mL (ref 30.0–100.0)

## 2021-08-25 LAB — TSH: TSH: 1.28 u[IU]/mL (ref 0.450–4.500)

## 2021-08-25 LAB — PSA: Prostate Specific Ag, Serum: 0.9 ng/mL (ref 0.0–4.0)

## 2021-08-27 ENCOUNTER — Other Ambulatory Visit: Payer: Self-pay | Admitting: Internal Medicine

## 2021-08-27 DIAGNOSIS — E1165 Type 2 diabetes mellitus with hyperglycemia: Secondary | ICD-10-CM

## 2021-08-27 MED ORDER — GLIPIZIDE 5 MG PO TABS: 5.0000 mg | ORAL_TABLET | Freq: Two times a day (BID) | ORAL | 1 refills | Status: DC

## 2021-08-28 ENCOUNTER — Telehealth: Payer: Self-pay

## 2021-08-28 NOTE — Telephone Encounter (Signed)
Patient returning lab result call 

## 2021-08-28 NOTE — Telephone Encounter (Signed)
Pt advised with verbal understanding  °

## 2021-09-07 ENCOUNTER — Ambulatory Visit: Payer: BC Managed Care – PPO | Admitting: Audiologist

## 2021-10-27 ENCOUNTER — Other Ambulatory Visit: Payer: Self-pay | Admitting: Internal Medicine

## 2021-10-27 DIAGNOSIS — I10 Essential (primary) hypertension: Secondary | ICD-10-CM

## 2021-11-17 ENCOUNTER — Other Ambulatory Visit: Payer: Self-pay | Admitting: Internal Medicine

## 2021-11-17 DIAGNOSIS — E1165 Type 2 diabetes mellitus with hyperglycemia: Secondary | ICD-10-CM

## 2022-01-10 IMAGING — DX DG CHEST 2V
2 series · 2 of 2 positions shown · non-contrast
Comparison: No priors.

CLINICAL DATA: 53-year-old male with history of chronic cough.

EXAM:
CHEST - 2 VIEW

[chest pa]
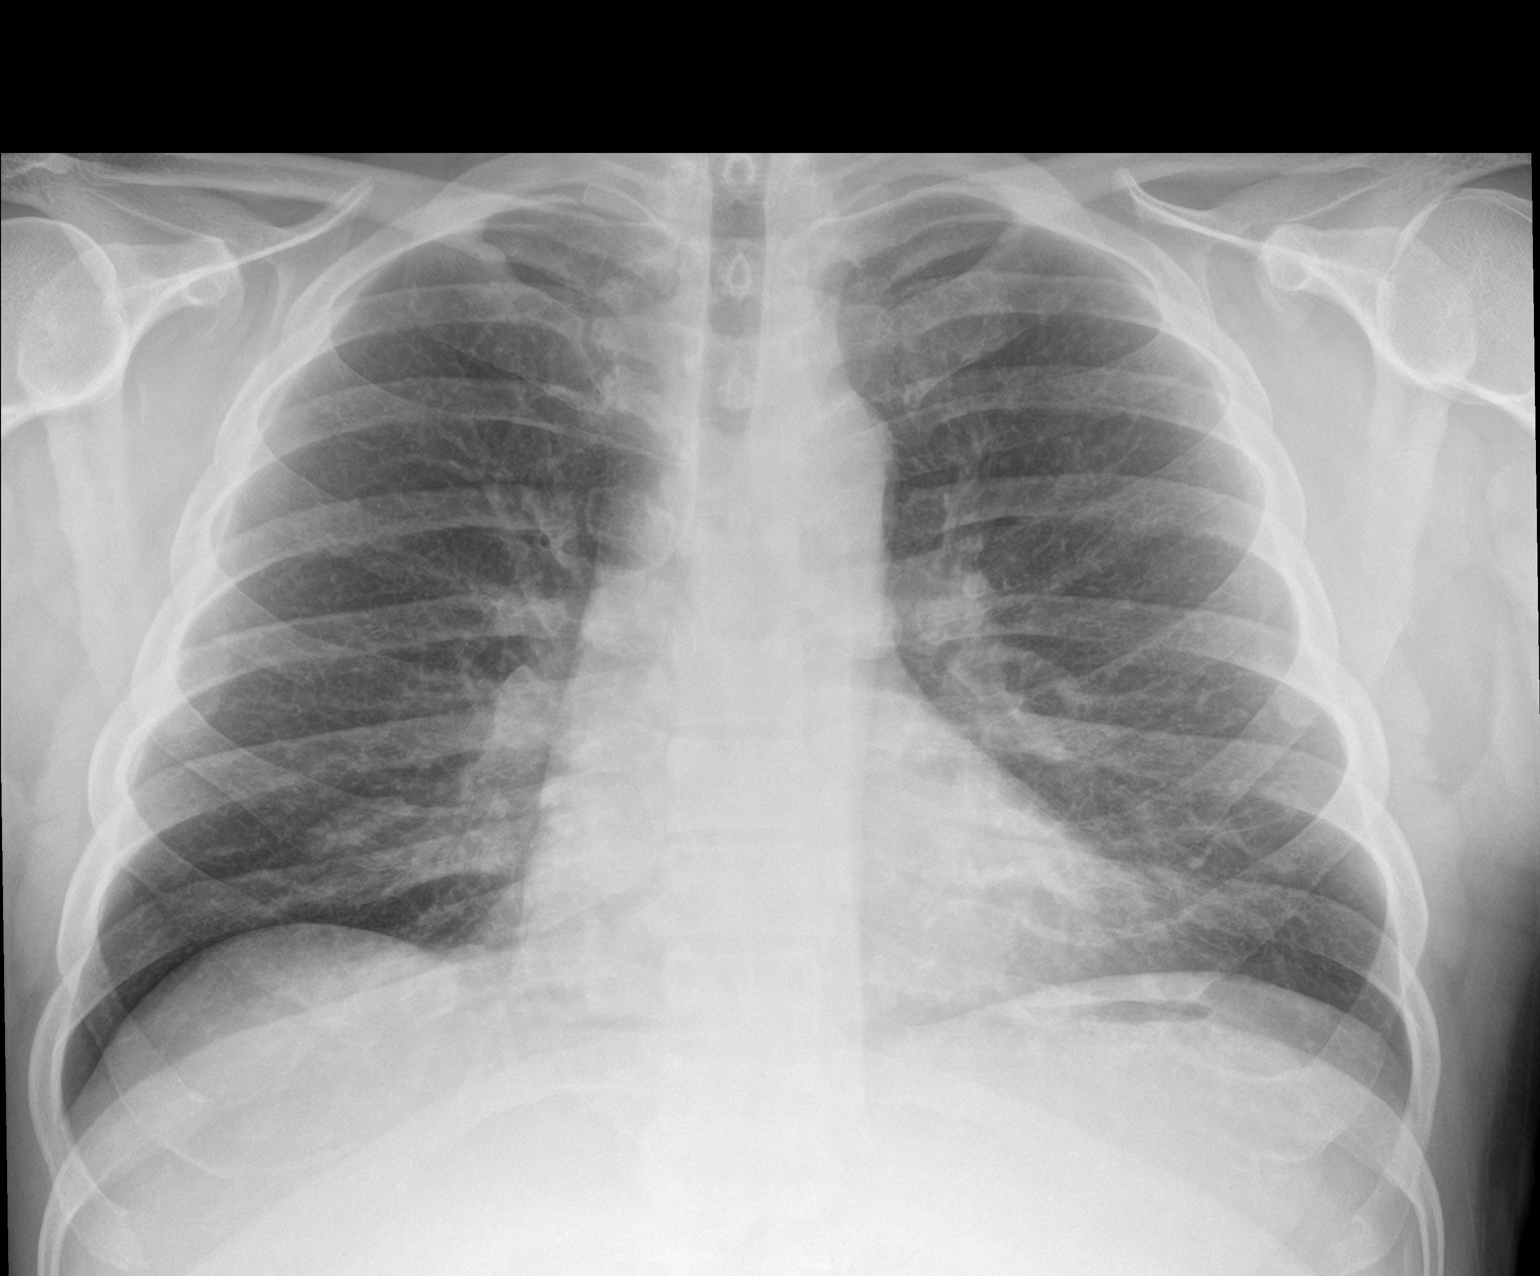

[chest lat]
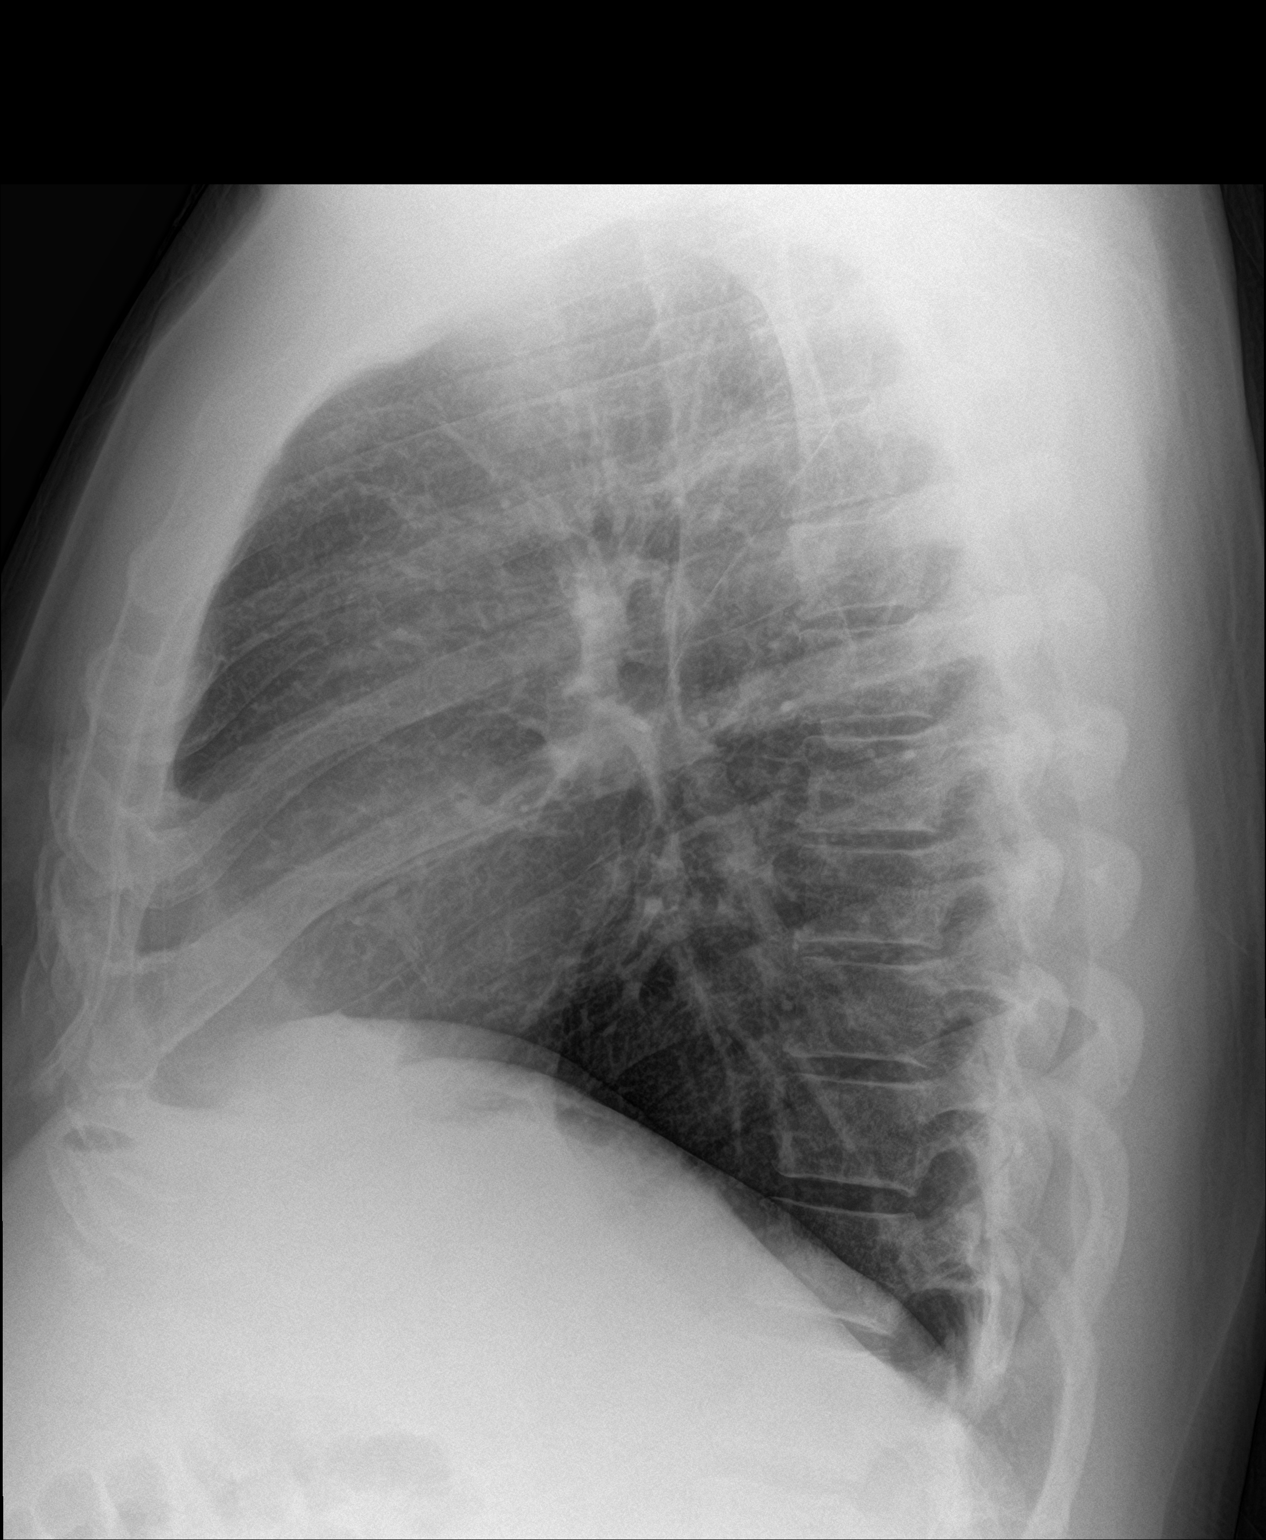

[2 of 2 positions shown; findings below may reference images not displayed]

FINDINGS: Lung volumes are normal. No consolidative airspace disease. No
pleural effusions. No pneumothorax. No pulmonary nodule or mass
noted. Pulmonary vasculature and the cardiomediastinal silhouette
are within normal limits.
IMPRESSION: No radiographic evidence of acute cardiopulmonary disease.

## 2022-01-12 ENCOUNTER — Other Ambulatory Visit: Payer: Self-pay | Admitting: Internal Medicine

## 2022-01-12 DIAGNOSIS — I10 Essential (primary) hypertension: Secondary | ICD-10-CM

## 2022-01-14 MED ORDER — LOSARTAN POTASSIUM-HCTZ 100-12.5 MG PO TABS: 1.0000 | ORAL_TABLET | Freq: Every day | ORAL | 0 refills | Status: DC

## 2022-02-07 NOTE — Progress Notes (Unsigned)
Clinical Summary Gavin Harris is a 56 y.o.male  seen today for follow up of the following medical problems.    Previously followed by [facility] cardiology     1. CAD - history of STEMI in 2016, received DES to RCA. He had moderate nonobstructive disease in the LAD. He had mildly reduced LV function with an EF of 45-50% by echo in September 2016.  12/2018 neg stress echo   -no chest pains, no SOB/DOE  - compliant with meds   2. HTN - compliant with meds     3. Hyperlipidemia 10/2019 TC 11 HDL 32 TG 157 LDL 56 10/2020 TC 118 HDL 40 TG 83 LDL 61   03/2021 TC 121 TG 78 HDL 36 LDL 69   4. DM2 - last A1c 7.9 - followed by pcp         SH: works as Administrator, [employer]   Past Medical History:  Diagnosis Date   Acute ST elevation myocardial infarction (STEMI) ([place]) 02/07/2015   CAD (coronary artery disease) 2015   Stent placement   Diabetes mellitus without complication (HCC)    GERD (gastroesophageal reflux disease)    Hypercholesteremia    Hypertension    Myocardial infarction ([place]) 2014     Allergies  Allergen Reactions   Penicillins Hives and Other (See Comments)    Mother told him      Current Outpatient Medications  Medication Sig Dispense Refill   aspirin 81 MG EC tablet Take 81 mg by mouth daily.     glipiZIDE (GLUCOTROL) 5 MG tablet Take 1 tablet (5 mg total) by mouth 2 (two) times daily before a meal. 180 tablet 1   JARDIANCE 10 MG TABS tablet Take 1 tablet by mouth once daily 90 tablet 0   losartan-hydrochlorothiazide (HYZAAR) 100-12.5 MG tablet Take 1 tablet by mouth daily. 90 tablet 0   metFORMIN (GLUCOPHAGE) 1000 MG tablet Take 1 tablet (1,000 mg total) by mouth 2 (two) times daily with a meal. 180 tablet 1   Multiple Vitamin (MULTI-VITAMIN) tablet Take 1 tablet by mouth daily.     OVER THE COUNTER MEDICATION Take 3,000 mg by mouth daily. Nugenix Ultimate     rosuvastatin (CRESTOR) 40 MG tablet Take 1 tablet (40 mg total) by  mouth daily. 90 tablet 3   tadalafil (CIALIS) 20 MG tablet TAKE 1 TABLET BY MOUTH AS NEEDED FOR ERECTILE DYSFUNCTION 20 tablet 11   No current facility-administered medications for this visit.     Past Surgical History:  Procedure Laterality Date   COLONOSCOPY  05/11/2015   Digestive health specialist; tubular adenoma and hyperplastic polyp.   COLONOSCOPY WITH PROPOFOL N/A 08/29/2020   Procedure: COLONOSCOPY WITH PROPOFOL;  Surgeon: Eloise Harman, DO;  [facility];  Service: Endoscopy;  Laterality: N/A;  AM (diabetic)   CORONARY ANGIOPLASTY WITH STENT PLACEMENT  2015   POLYPECTOMY  08/29/2020   Procedure: POLYPECTOMY;  Surgeon: Eloise Harman, DO;  [facility];  Service: Endoscopy;;   tumor in chest removed     at age 66.      Allergies  Allergen Reactions   Penicillins Hives and Other (See Comments)    Mother told him       Family History  Problem Relation Age of Onset   Breast cancer Mother    Lung cancer Father    Colon cancer Neg Hx      Social History Gavin Harris reports  that he has been smoking cigarettes. He has a 22.50 pack-year smoking history. He has never used smokeless tobacco. Gavin Harris reports that he does not currently use alcohol.   Review of Systems CONSTITUTIONAL: No weight loss, fever, chills, weakness or fatigue.  HEENT: Eyes: No visual loss, blurred vision, double vision or yellow sclerae.No hearing loss, sneezing, congestion, runny nose or sore throat.  SKIN: No rash or itching.  CARDIOVASCULAR:  RESPIRATORY: No shortness of breath, cough or sputum.  GASTROINTESTINAL: No anorexia, nausea, vomiting or diarrhea. No abdominal pain or blood.  GENITOURINARY: No burning on urination, no polyuria NEUROLOGICAL: No headache, dizziness, syncope, paralysis, ataxia, numbness or tingling in the extremities. No change in bowel or bladder control.  MUSCULOSKELETAL: No muscle, back pain, joint pain or stiffness.  LYMPHATICS: No  enlarged nodes. No history of splenectomy.  PSYCHIATRIC: No history of depression or anxiety.  ENDOCRINOLOGIC: No reports of sweating, cold or heat intolerance. No polyuria or polydipsia.  Marland Kitchen   Physical Examination There were no vitals filed for this visit. There were no vitals filed for this visit.  Gen: resting comfortably, no acute distress HEENT: no scleral icterus, pupils equal round and reactive, no palptable cervical adenopathy,  CV Resp: Clear to auscultation bilaterally GI: abdomen is soft, non-tender, non-distended, normal bowel sounds, no hepatosplenomegaly MSK: extremities are warm, no edema.  Skin: warm, no rash Neuro:  no focal deficits Psych: appropriate affect   Diagnostic Studies  12/2018 stress [facility] SUMMARY The patient had no chest pain. The patient achieved 89 % of maximum predicted heart rate. The METS achieved was 8. Exercise capacity was average. LV function mildly decreased at rest: 45%. There is inferior wall and inferolateral wall hypokinesis No augmentation of this wall with stress Negative stress ECG for inducible ischemia at target heart rate. No new wall motion with stress The stress is negative for ischemia and shows prior RCA territory infarction - FINDINGS: - ECG REST The baseline ECG displays normal sinus rhythm. RBBB. Baseline ECG showed  inferior myocardial infarction. - ECG STRESS No diagnostic ST segment changes were seen. Negative stress ECG for inducible  ischemia at target heart rate. ECG changes, blood pressure and heart rate  responses during stress test are shown in the Cardiology Scan portion of this  report in the EPIC. - REST ECHO LV function mildly decreased at rest 45%. There is inferior wall and inferolateral wall hypokinesis No augmentation of this wall with stress. - STRESS ECHO There were no segmental wall motion abnormalities post exercise. The  estimated LV ejection fraction is 55-60% with stress.  Negative exercise  echocardiography for inducible ischemia at target heart rate.   01/2015 Echo [place]  The left ventricular size is normal.   Left ventricular systolic function is mildly reduced.  LV ejection fraction = 45-50%.   Regional wall motion abnormalities as specified below.  The right ventricle is normal in size and function.  The left atrial size is normal.  Structurally normal aortic valve.  There is no aortic regurgitation.  The mitral valve leaflets appear normal.  There is trace mitral regurgitation.  Structurally normal tricuspid valve.  There is trace tricuspid regurgitation.  Structurally normal pulmonic valve.  There is no pulmonic valvular regurgitation.  There is no pericardial effusion.  There is no comparison study available.      01/2015 [facility] OTHER:  Patient underwent successful primary PCI of RCA. LAD has several  50-60% proximal to mid stenoses. LCX has diffuse  disease and small. RCA is a  large vessel with 100% thrombotic occlusion. RCA lesion was stented with a  3.5X71m Premier followed by post-dilation with 4.035mNC balloon. There is 0%  residual stenosis and TIMI III flow. Thrombectomy with Export catheter was  performed. Intra-coronary ReoPro was used. Patient will be on IV ReoPro for  12 hrs. There is no procedural complication. Loss of blood is minimal. 11    Assessment and Plan   1. CAD - no symptoms, continue current meds - EKG today SR, chronic RBBB, no acute ischemic changes   2. HTN - at goal, continue current meds     3. Hyperlipidemia -prior MI with multiple high risk factors, would set goal LDL <55 - change lipitor to crestor '40mg'$  daily.      JoArnoldo LenisM.D., F.A.C.C.

## 2022-02-08 ENCOUNTER — Ambulatory Visit: Payer: BC Managed Care – PPO | Attending: Cardiology | Admitting: Cardiology

## 2022-02-08 ENCOUNTER — Encounter: Payer: Self-pay | Admitting: Cardiology

## 2022-02-08 VITALS — BP 100/68 | HR 91 | Ht 69.0 in | Wt 217.2 lb

## 2022-02-08 DIAGNOSIS — I10 Essential (primary) hypertension: Secondary | ICD-10-CM | POA: Diagnosis not present

## 2022-02-08 DIAGNOSIS — I2510 Atherosclerotic heart disease of native coronary artery without angina pectoris: Secondary | ICD-10-CM

## 2022-02-08 DIAGNOSIS — E782 Mixed hyperlipidemia: Secondary | ICD-10-CM | POA: Diagnosis not present

## 2022-02-08 NOTE — Patient Instructions (Signed)

## 2022-02-10 ENCOUNTER — Other Ambulatory Visit: Payer: Self-pay | Admitting: Internal Medicine

## 2022-02-10 DIAGNOSIS — E1165 Type 2 diabetes mellitus with hyperglycemia: Secondary | ICD-10-CM

## 2022-02-17 ENCOUNTER — Other Ambulatory Visit: Payer: Self-pay | Admitting: Internal Medicine

## 2022-02-17 DIAGNOSIS — E1165 Type 2 diabetes mellitus with hyperglycemia: Secondary | ICD-10-CM

## 2022-03-01 ENCOUNTER — Encounter: Payer: Self-pay | Admitting: Internal Medicine

## 2022-03-01 ENCOUNTER — Ambulatory Visit: Payer: BC Managed Care – PPO | Admitting: Internal Medicine

## 2022-03-01 VITALS — BP 124/62 | HR 86 | Resp 18 | Ht 69.0 in | Wt 214.2 lb

## 2022-03-01 DIAGNOSIS — Z720 Tobacco use: Secondary | ICD-10-CM

## 2022-03-01 DIAGNOSIS — I10 Essential (primary) hypertension: Secondary | ICD-10-CM | POA: Diagnosis not present

## 2022-03-01 DIAGNOSIS — E782 Mixed hyperlipidemia: Secondary | ICD-10-CM | POA: Diagnosis not present

## 2022-03-01 DIAGNOSIS — E559 Vitamin D deficiency, unspecified: Secondary | ICD-10-CM

## 2022-03-01 DIAGNOSIS — E1165 Type 2 diabetes mellitus with hyperglycemia: Secondary | ICD-10-CM | POA: Diagnosis not present

## 2022-03-01 DIAGNOSIS — Z2821 Immunization not carried out because of patient refusal: Secondary | ICD-10-CM

## 2022-03-01 DIAGNOSIS — Z125 Encounter for screening for malignant neoplasm of prostate: Secondary | ICD-10-CM

## 2022-03-01 LAB — POCT GLYCOSYLATED HEMOGLOBIN (HGB A1C)
HbA1c POC (<> result, manual entry): 8.3 % (ref 4.0–5.6)
HbA1c, POC (controlled diabetic range): 8.3 % — AB (ref 0.0–7.0)

## 2022-03-01 MED ORDER — EMPAGLIFLOZIN 25 MG PO TABS: 25.0000 mg | ORAL_TABLET | Freq: Every day | ORAL | 3 refills | Status: AC

## 2022-03-01 NOTE — Assessment & Plan Note (Signed)
BP Readings from Last 1 Encounters:  03/01/22 124/62   Well-controlled Counseled for compliance with the medications Advised DASH diet and moderate exercise/walking, at least 150 mins/week

## 2022-03-01 NOTE — Assessment & Plan Note (Signed)
Smokes about 1-2 cigarettes/day  Asked about quitting: confirms that he/she currently smokes cigarettes Advise to quit smoking: Educated about QUITTING to reduce the risk of cancer, cardio and cerebrovascular disease. Assess willingness: Unwilling to quit at this time, but is working on cutting back. Assist with counseling and pharmacotherapy: Counseled for 5 minutes and literature provided. Arrange for follow up: follow up in 3 months and continue to offer help.

## 2022-03-01 NOTE — Assessment & Plan Note (Signed)
Takes Atorvastatin 80 mg QD Check lipid profile

## 2022-03-01 NOTE — Patient Instructions (Addendum)
Please continue taking medications as prescribed.  Please continue to follow low carb diet and ambulate as tolerated.  Please get fasting blood tests done before the next visit. 

## 2022-03-01 NOTE — Assessment & Plan Note (Addendum)
Lab Results  Component Value Date   HGBA1C 8.2 (H) 08/24/2021   Uncontrolled On metformin, glipizide and Jardiance, needs to be compliant Increased dose of Jardiance to 25 mg QD Advised to follow diabetic diet On statin and ARB F/u CMP and lipid panel Diabetic eye exam: Advised to follow up with Ophthalmology for diabetic eye exam

## 2022-03-01 NOTE — Progress Notes (Signed)
Established Patient Office Visit  Subjective:  Patient ID: Gavin Harris, male    DOB: 1965-09-11  Age: 56 y.o. MRN: [id number]  CC:  Chief Complaint  Patient presents with   Diabetes   Hypertension    HPI Gavin Harris is a 56 y.o. male with past medical history of CAD s/p stent placement, HTN, GERD, type II DM, tobacco abuse, and obesity who presents for f/u of his chronic medical conditions.  CAD and HTN: BP is well-controlled. Takes medications regularly. Patient denies headache, dizziness, chest pain, dyspnea or palpitations.  Type 2 DM: His HbA1c is still elevated at 8.3.  Of note, he admits that he misses evening doses of metformin and glipizide at times.  He also takes Jardiance 10 mg currently.  He denies any polyuria or polyphagia.  He has cut down on smoking and reports that he smokes 1 to 2 cigarettes/day.    Past Medical History:  Diagnosis Date   Acute ST elevation myocardial infarction (STEMI) ([place]) 02/07/2015   CAD (coronary artery disease) 2015   Stent placement   Diabetes mellitus without complication (HCC)    GERD (gastroesophageal reflux disease)    Hypercholesteremia    Hypertension    Myocardial infarction [facility]) 2014    Past Surgical History:  Procedure Laterality Date   COLONOSCOPY  05/11/2015   Digestive health specialist; tubular adenoma and hyperplastic polyp.   COLONOSCOPY WITH PROPOFOL N/A 08/29/2020   Procedure: COLONOSCOPY WITH PROPOFOL;  Surgeon: Eloise Harman, DO;  [facility];  Service: Endoscopy;  Laterality: N/A;  AM (diabetic)   CORONARY ANGIOPLASTY WITH STENT PLACEMENT  2015   POLYPECTOMY  08/29/2020   Procedure: POLYPECTOMY;  Surgeon: Eloise Harman, DO;  [facility];  Service: Endoscopy;;   tumor in chest removed     at age 50.     Family History  Problem Relation Age of Onset   Breast cancer Mother    Lung cancer Father    Colon cancer Neg Hx     Social History   Socioeconomic History    Marital status: Single    Spouse name: Not on file   Number of children: 3   Years of education: Not on file   Highest education level: Not on file  Occupational History   Occupation: truck driver  Tobacco Use   Smoking status: Some Days    Packs/day: 0.75    Years: 30.00    Total pack years: 22.50    Types: Cigarettes    Last attempt to quit: 04/14/2021    Years since quitting: 0.8    Passive exposure: Never   Smokeless tobacco: Never  Vaping Use   Vaping Use: Never used  Substance and Sexual Activity   Alcohol use: Not Currently    Comment: Quit 3 years ago.  (06/16/2020)   Drug use: Not Currently    Types: Marijuana, Cocaine    Comment: 30 years ago.    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   Social Determinants of Health   Financial Resource Strain: Not on file  Food Insecurity: Not on file  Transportation Needs: Not on file  Physical Activity: Not on file  Stress: Not on file  Social Connections: Not on file  Intimate Partner Violence: Not on file    Outpatient Medications Prior to Visit  Medication Sig Dispense Refill  aspirin 81 MG EC tablet Take 81 mg by mouth daily.     glipiZIDE (GLUCOTROL) 5 MG tablet TAKE 1 TABLET BY MOUTH TWICE DAILY BEFORE A MEAL 180 tablet 0   losartan-hydrochlorothiazide (HYZAAR) 100-12.5 MG tablet Take 1 tablet by mouth daily. 90 tablet 0   metFORMIN (GLUCOPHAGE) 1000 MG tablet Take 1 tablet (1,000 mg total) by mouth 2 (two) times daily with a meal. 180 tablet 1   Multiple Vitamin (MULTI-VITAMIN) tablet Take 1 tablet by mouth daily.     OVER THE COUNTER MEDICATION Take 3,000 mg by mouth daily. Nugenix Ultimate     rosuvastatin (CRESTOR) 40 MG tablet Take 1 tablet (40 mg total) by mouth daily. 90 tablet 3   tadalafil (CIALIS) 20 MG tablet TAKE 1 TABLET BY MOUTH AS NEEDED FOR ERECTILE DYSFUNCTION 20 tablet 11   JARDIANCE 10  MG TABS tablet Take 1 tablet by mouth once daily 90 tablet 0   No facility-administered medications prior to visit.    Allergies  Allergen Reactions   Penicillins Hives and Other (See Comments)    Mother told him     ROS Review of Systems  Constitutional:  Negative for chills and fever.  HENT:  Negative for congestion and sore throat.   Eyes:  Negative for pain and discharge.  Respiratory:  Negative for cough and shortness of breath.   Cardiovascular:  Negative for chest pain and palpitations.  Gastrointestinal:  Negative for constipation, diarrhea, nausea and vomiting.  Endocrine: Negative for polydipsia and polyuria.  Genitourinary:  Negative for dysuria and hematuria.  Musculoskeletal:  Negative for neck pain and neck stiffness.  Skin:  Negative for rash.  Neurological:  Negative for dizziness, weakness, numbness and headaches.  Psychiatric/Behavioral:  Negative for agitation and behavioral problems.       Objective:    Physical Exam Vitals reviewed.  Constitutional:      General: He is not in acute distress.    Appearance: He is not diaphoretic.  HENT:     Head: Normocephalic and atraumatic.     Nose: Nose normal.     Mouth/Throat:     Mouth: Mucous membranes are moist.  Eyes:     General: No scleral icterus.    Extraocular Movements: Extraocular movements intact.  Cardiovascular:     Rate and Rhythm: Normal rate and regular rhythm.     Pulses: Normal pulses.     Heart sounds: Normal heart sounds. No murmur heard. Pulmonary:     Breath sounds: Normal breath sounds. No wheezing or rales.  Musculoskeletal:     Cervical back: Neck supple. No tenderness.     Right lower leg: No edema.     Left lower leg: No edema.  Skin:    General: Skin is warm.     Findings: No rash.  Neurological:     General: No focal deficit present.     Mental Status: He is alert and oriented to person, place, and time.     Sensory: No sensory deficit.     Motor: No weakness.   Psychiatric:        Mood and Affect: Mood normal.        Behavior: Behavior normal.     BP 124/62 (BP Location: Right Arm, Patient Position: Sitting, Cuff Size: Normal)   Pulse 86   Resp 18   Ht _0  (1.753 m)   Wt 214 lb 3.2 oz (97.2 kg)   SpO2 96%   BMI 31.63 kg/m  Wt  Readings from Last 3 Encounters:  03/01/22 214 lb 3.2 oz (97.2 kg)  02/08/22 217 lb 3.2 oz (98.5 kg)  08/24/21 216 lb (98 kg)    Lab Results  Component Value Date   TSH 1.160 08/24/2021   Lab Results  Component Value Date   WBC 6.8 08/24/2021   HGB 17.2 08/24/2021   HCT 50.6 08/24/2021   MCV 91 08/24/2021   PLT 139 (L) 08/24/2021   Lab Results  Component Value Date   NA 142 08/24/2021   K 4.9 08/24/2021   CO2 26 08/24/2021   GLUCOSE 167 (H) 08/24/2021   BUN 12 08/24/2021   CREATININE 1.04 08/24/2021   BILITOT 0.6 08/24/2021   ALKPHOS 98 08/24/2021   AST 22 08/24/2021   ALT 34 08/24/2021   PROT 7.3 08/24/2021   ALBUMIN 4.7 08/24/2021   CALCIUM 10.5 (H) 08/24/2021   ANIONGAP 11 08/25/2020   EGFR 85 08/24/2021   Lab Results  Component Value Date   CHOL 96 (L) 08/24/2021   Lab Results  Component Value Date   HDL 31 (L) 08/24/2021   Lab Results  Component Value Date   LDLCALC 46 08/24/2021   Lab Results  Component Value Date   TRIG 95 08/24/2021   Lab Results  Component Value Date   CHOLHDL 3.1 08/24/2021   Lab Results  Component Value Date   HGBA1C 8.3 03/01/2022   HGBA1C 8.3 (A) 03/01/2022      Assessment & Plan:   Problem List Items Addressed This Visit       Cardiovascular and Mediastinum   Essential hypertension    BP Readings from Last 1 Encounters:  03/01/22 124/62  Well-controlled Counseled for compliance with the medications Advised DASH diet and moderate exercise/walking, at least 150 mins/week       Relevant Orders   TSH   CBC with Differential/Platelet     Endocrine   Type 2 diabetes mellitus with hyperglycemia, without long-term current use of  insulin (HCC) - Primary    Lab Results  Component Value Date   HGBA1C 8.2 (H) 08/24/2021  Uncontrolled On metformin, glipizide and Jardiance, needs to be compliant Increased dose of Jardiance to 25 mg QD Advised to follow diabetic diet On statin and ARB F/u CMP and lipid panel Diabetic eye exam: Advised to follow up with Ophthalmology for diabetic eye exam      Relevant Medications   empagliflozin (JARDIANCE) 25 MG TABS tablet   Other Relevant Orders   Microalbumin / creatinine urine ratio   Hemoglobin A1c   CMP14+EGFR   POCT glycosylated hemoglobin (Hb A1C) (Completed)     Other   Tobacco abuse    Smokes about 1-2 cigarettes/day  Asked about quitting: confirms that he/she currently smokes cigarettes Advise to quit smoking: Educated about QUITTING to reduce the risk of cancer, cardio and cerebrovascular disease. Assess willingness: Unwilling to quit at this time, but is working on cutting back. Assist with counseling and pharmacotherapy: Counseled for 5 minutes and literature provided. Arrange for follow up: follow up in 3 months and continue to offer help.      Mixed hyperlipidemia    Takes Atorvastatin 80 mg QD Check lipid profile      Relevant Orders   Lipid Profile   Prostate cancer screening   Relevant Orders   PSA   Other Visit Diagnoses     Refused influenza vaccine       Vitamin D deficiency       Relevant Orders  VITAMIN D 25 Hydroxy (Vit-D Deficiency, Fractures)       Meds ordered this encounter  Medications   empagliflozin (JARDIANCE) 25 MG TABS tablet    Sig: Take 1 tablet (25 mg total) by mouth daily.    Dispense:  90 tablet    Refill:  3    Dose change    Follow-up: Return in about 6 months (around 08/31/2022) for Annual physical.    Lindell Spar, MD

## 2022-03-03 LAB — MICROALBUMIN / CREATININE URINE RATIO
Creatinine, Urine: 78.8 mg/dL
Microalb/Creat Ratio: <4 mg/g creat (ref 0–29)
Microalbumin, Urine: <3 ug/mL

## 2022-03-08 ENCOUNTER — Ambulatory Visit (INDEPENDENT_AMBULATORY_CARE_PROVIDER_SITE_OTHER): Payer: BC Managed Care – PPO | Admitting: *Deleted

## 2022-03-08 DIAGNOSIS — Z23 Encounter for immunization: Secondary | ICD-10-CM | POA: Diagnosis not present

## 2022-03-21 ENCOUNTER — Other Ambulatory Visit: Payer: Self-pay | Admitting: Urology

## 2022-03-21 DIAGNOSIS — N529 Male erectile dysfunction, unspecified: Secondary | ICD-10-CM

## 2022-03-27 ENCOUNTER — Other Ambulatory Visit: Payer: Self-pay | Admitting: Urology

## 2022-03-27 DIAGNOSIS — N529 Male erectile dysfunction, unspecified: Secondary | ICD-10-CM

## 2022-03-28 ENCOUNTER — Telehealth: Payer: Self-pay

## 2022-03-28 ENCOUNTER — Other Ambulatory Visit: Payer: Self-pay

## 2022-03-28 ENCOUNTER — Other Ambulatory Visit: Payer: Self-pay | Admitting: Urology

## 2022-03-28 DIAGNOSIS — N529 Male erectile dysfunction, unspecified: Secondary | ICD-10-CM

## 2022-03-28 NOTE — Telephone Encounter (Signed)
Patient called and advised they needed a refill on medication below.   Medication: tadalafil (CIALIS) 20 MG tablet    Pharmacy: [facility], [place] [id number]    Thank you

## 2022-03-28 NOTE — Progress Notes (Signed)
Opened in error

## 2022-03-29 NOTE — Telephone Encounter (Signed)
Please see below, pt last seen 02/01/2020.  Do you want to refill or does pt need an apt to continue renewing rx.  Please advise.

## 2022-04-06 ENCOUNTER — Other Ambulatory Visit: Payer: Self-pay | Admitting: Internal Medicine

## 2022-04-06 DIAGNOSIS — I10 Essential (primary) hypertension: Secondary | ICD-10-CM

## 2022-04-09 ENCOUNTER — Other Ambulatory Visit: Payer: Self-pay | Admitting: Urology

## 2022-04-09 DIAGNOSIS — N529 Male erectile dysfunction, unspecified: Secondary | ICD-10-CM

## 2022-04-09 MED ORDER — TADALAFIL 20 MG PO TABS: ORAL_TABLET | ORAL | 11 refills | Status: AC

## 2022-04-16 ENCOUNTER — Ambulatory Visit (INDEPENDENT_AMBULATORY_CARE_PROVIDER_SITE_OTHER): Payer: BC Managed Care – PPO | Admitting: Urology

## 2022-04-16 ENCOUNTER — Encounter: Payer: Self-pay | Admitting: Urology

## 2022-04-16 VITALS — BP 123/83 | HR 91

## 2022-04-16 DIAGNOSIS — N529 Male erectile dysfunction, unspecified: Secondary | ICD-10-CM

## 2022-04-16 NOTE — Progress Notes (Signed)
History of Present Illness: Here for check for ED. He has been on cialis--1/2 or 1 prn.  He sometimes does not need it.  No LUTS.--IPSS  4  QoL score 2  Most recent PSA 0.9 in March. Past Medical History:  Diagnosis Date   Acute ST elevation myocardial infarction (STEMI) ([place]) 02/07/2015   CAD (coronary artery disease) 2015   Stent placement   Diabetes mellitus without complication (HCC)    GERD (gastroesophageal reflux disease)    Hypercholesteremia    Hypertension    Myocardial infarction [facility]) 2014    Past Surgical History:  Procedure Laterality Date   COLONOSCOPY  05/11/2015   Digestive health specialist; tubular adenoma and hyperplastic polyp.   COLONOSCOPY WITH PROPOFOL N/A 08/29/2020   Procedure: COLONOSCOPY WITH PROPOFOL;  Surgeon: Eloise Harman, DO;  [facility];  Service: Endoscopy;  Laterality: N/A;  AM (diabetic)   CORONARY ANGIOPLASTY WITH STENT PLACEMENT  2015   POLYPECTOMY  08/29/2020   Procedure: POLYPECTOMY;  Surgeon: Eloise Harman, DO;  [facility];  Service: Endoscopy;;   tumor in chest removed     at age 56.     Home Medications:  Allergies as of 04/16/2022       Reactions   Penicillins Hives, Other (See Comments)   Mother told him        Medication List        Accurate as of April 16, 2022  2:37 PM. If you have any questions, ask your nurse or doctor.          aspirin EC 81 MG tablet Take 81 mg by mouth daily.   empagliflozin 25 MG Tabs tablet Commonly known as: Jardiance Take 1 tablet (25 mg total) by mouth daily.   glipiZIDE 5 MG tablet Commonly known as: GLUCOTROL TAKE 1 TABLET BY MOUTH TWICE DAILY BEFORE A MEAL   losartan-hydrochlorothiazide 100-12.5 MG tablet Commonly known as: HYZAAR Take 1 tablet by mouth once daily   metFORMIN 1000 MG tablet Commonly known as: GLUCOPHAGE Take 1 tablet (1,000 mg total) by mouth 2 (two) times daily with a meal.   Multi-Vitamin tablet Take 1 tablet by  mouth daily.   OVER THE COUNTER MEDICATION Take 3,000 mg by mouth daily. Nugenix Ultimate   rosuvastatin 40 MG tablet Commonly known as: CRESTOR Take 1 tablet (40 mg total) by mouth daily.   tadalafil 20 MG tablet Commonly known as: CIALIS Take 1/2 to 1 tablet p.o. as needed        Allergies:  Allergies  Allergen Reactions   Penicillins Hives and Other (See Comments)    Mother told him     Family History  Problem Relation Age of Onset   Breast cancer Mother    Lung cancer Father    Colon cancer Neg Hx     Social History:  reports that he has been smoking cigarettes. He has a 22.50 pack-year smoking history. He has never been exposed to tobacco smoke. He has never used smokeless tobacco. He reports that he does not currently use alcohol. He reports that he does not currently use drugs after having used the following drugs: Marijuana and Cocaine.  ROS: A complete review of systems was performed.  All systems are negative except for pertinent findings as noted.  Physical Exam:  Vital signs in last 24 hours: BP 123/83   Pulse 91  Constitutional:  Alert and oriented, No acute distress Cardiovascular: Regular rate  Respiratory: Normal respiratory  effort Neurologic: Grossly intact, no focal deficits Psychiatric: Normal mood and affect  I have reviewed prior pt notes  I have reviewed urinalysis results  I have reviewed prior PSA results    Impression/Assessment:  ED, doing well on Cialis  Plan:  I will have him come back in 2 years to recheck

## 2022-04-16 NOTE — Addendum Note (Signed)
Addended by: Darcella Gasman R on: 04/16/2022 03:38 PM   Modules accepted: Orders

## 2022-04-17 LAB — URINALYSIS, ROUTINE W REFLEX MICROSCOPIC
Bilirubin, UA: NEGATIVE
Ketones, UA: NEGATIVE
Leukocytes,UA: NEGATIVE
Nitrite, UA: NEGATIVE
Protein,UA: NEGATIVE
RBC, UA: NEGATIVE
Specific Gravity, UA: 1.01 (ref 1.005–1.030)
Urobilinogen, Ur: 1 mg/dL (ref 0.2–1.0)
pH, UA: 5.5 (ref 5.0–7.5)

## 2022-06-01 ENCOUNTER — Other Ambulatory Visit: Payer: Self-pay | Admitting: Internal Medicine

## 2022-06-01 DIAGNOSIS — E1165 Type 2 diabetes mellitus with hyperglycemia: Secondary | ICD-10-CM

## 2022-07-13 ENCOUNTER — Encounter: Payer: Self-pay | Admitting: Cardiology

## 2022-07-27 ENCOUNTER — Other Ambulatory Visit: Payer: Self-pay | Admitting: Cardiology

## 2022-08-03 ENCOUNTER — Other Ambulatory Visit: Payer: Self-pay | Admitting: Internal Medicine

## 2022-08-03 DIAGNOSIS — I10 Essential (primary) hypertension: Secondary | ICD-10-CM

## 2022-08-24 LAB — LIPID PANEL
Chol/HDL Ratio: 3.5 ratio (ref 0.0–5.0)
Cholesterol, Total: 112 mg/dL (ref 100–199)
HDL: 32 mg/dL — ABNORMAL LOW (ref >39)
LDL Chol Calc (NIH): 59 mg/dL (ref 0–99)
Triglycerides: 116 mg/dL (ref 0–149)
VLDL Cholesterol Cal: 21 mg/dL (ref 5–40)

## 2022-08-24 LAB — PSA: Prostate Specific Ag, Serum: 0.8 ng/mL (ref 0.0–4.0)

## 2022-08-24 LAB — CBC WITH DIFFERENTIAL/PLATELET
Basophils Absolute: 0.1 10*3/uL (ref 0.0–0.2)
Basos: 1 %
EOS (ABSOLUTE): 0.4 10*3/uL (ref 0.0–0.4)
Eos: 5 %
Hematocrit: 48 % (ref 37.5–51.0)
Hemoglobin: 16.5 g/dL (ref 13.0–17.7)
Immature Grans (Abs): 0 10*3/uL (ref 0.0–0.1)
Immature Granulocytes: 0 %
Lymphocytes Absolute: 1.8 10*3/uL (ref 0.7–3.1)
Lymphs: 25 %
MCH: 31.4 pg (ref 26.6–33.0)
MCHC: 34.4 g/dL (ref 31.5–35.7)
MCV: 91 fL (ref 79–97)
Monocytes Absolute: 0.4 10*3/uL (ref 0.1–0.9)
Monocytes: 6 %
Neutrophils Absolute: 4.3 10*3/uL (ref 1.4–7.0)
Neutrophils: 63 %
Platelets: 137 10*3/uL — ABNORMAL LOW (ref 150–450)
RBC: 5.25 x10E6/uL (ref 4.14–5.80)
RDW: 12.6 % (ref 11.6–15.4)
WBC: 7 10*3/uL (ref 3.4–10.8)

## 2022-08-24 LAB — CMP14+EGFR
ALT: 34 IU/L (ref 0–44)
AST: 21 IU/L (ref 0–40)
Albumin/Globulin Ratio: 1.8 (ref 1.2–2.2)
Albumin: 4.4 g/dL (ref 3.8–4.9)
Alkaline Phosphatase: 79 IU/L (ref 44–121)
BUN/Creatinine Ratio: 14 (ref 9–20)
BUN: 14 mg/dL (ref 6–24)
Bilirubin Total: 0.4 mg/dL (ref 0.0–1.2)
CO2: 21 mmol/L (ref 20–29)
Calcium: 9.4 mg/dL (ref 8.7–10.2)
Chloride: 105 mmol/L (ref 96–106)
Creatinine, Ser: 1.01 mg/dL (ref 0.76–1.27)
Globulin, Total: 2.4 g/dL (ref 1.5–4.5)
Glucose: 151 mg/dL — ABNORMAL HIGH (ref 70–99)
Potassium: 4.4 mmol/L (ref 3.5–5.2)
Sodium: 142 mmol/L (ref 134–144)
Total Protein: 6.8 g/dL (ref 6.0–8.5)
eGFR: 87 mL/min/{1.73_m2} (ref >59)

## 2022-08-24 LAB — HEMOGLOBIN A1C
Est. average glucose Bld gHb Est-mCnc: 189 mg/dL
Hgb A1c MFr Bld: 8.2 % — ABNORMAL HIGH (ref 4.8–5.6)

## 2022-08-24 LAB — VITAMIN D 25 HYDROXY (VIT D DEFICIENCY, FRACTURES): Vit D, 25-Hydroxy: 37.3 ng/mL (ref 30.0–100.0)

## 2022-08-24 LAB — TSH: TSH: 1.31 u[IU]/mL (ref 0.450–4.500)

## 2022-08-30 ENCOUNTER — Encounter: Payer: BC Managed Care – PPO | Admitting: Internal Medicine

## 2022-09-06 ENCOUNTER — Ambulatory Visit (INDEPENDENT_AMBULATORY_CARE_PROVIDER_SITE_OTHER): Payer: BC Managed Care – PPO | Admitting: Internal Medicine

## 2022-09-06 ENCOUNTER — Encounter: Payer: Self-pay | Admitting: Internal Medicine

## 2022-09-06 VITALS — BP 124/82 | HR 85 | Ht 69.0 in | Wt 219.4 lb

## 2022-09-06 DIAGNOSIS — E1165 Type 2 diabetes mellitus with hyperglycemia: Secondary | ICD-10-CM

## 2022-09-06 DIAGNOSIS — Z0001 Encounter for general adult medical examination with abnormal findings: Secondary | ICD-10-CM | POA: Diagnosis not present

## 2022-09-06 DIAGNOSIS — E782 Mixed hyperlipidemia: Secondary | ICD-10-CM

## 2022-09-06 DIAGNOSIS — I2510 Atherosclerotic heart disease of native coronary artery without angina pectoris: Secondary | ICD-10-CM

## 2022-09-06 DIAGNOSIS — E66811 Obesity, class 1: Secondary | ICD-10-CM

## 2022-09-06 DIAGNOSIS — Z122 Encounter for screening for malignant neoplasm of respiratory organs: Secondary | ICD-10-CM | POA: Insufficient documentation

## 2022-09-06 DIAGNOSIS — E669 Obesity, unspecified: Secondary | ICD-10-CM

## 2022-09-06 DIAGNOSIS — I10 Essential (primary) hypertension: Secondary | ICD-10-CM

## 2022-09-06 MED ORDER — METFORMIN HCL 1000 MG PO TABS: 1000.0000 mg | ORAL_TABLET | Freq: Two times a day (BID) | ORAL | 3 refills | Status: AC

## 2022-09-06 MED ORDER — LOSARTAN POTASSIUM-HCTZ 100-12.5 MG PO TABS: 1.0000 | ORAL_TABLET | Freq: Every day | ORAL | 3 refills | Status: AC

## 2022-09-06 MED ORDER — GLIPIZIDE 5 MG PO TABS: 5.0000 mg | ORAL_TABLET | Freq: Two times a day (BID) | ORAL | 1 refills | Status: DC

## 2022-09-06 NOTE — Assessment & Plan Note (Signed)
Has > 20-pack-year smoking history Ordered low-dose CT chest after discussing with the patient.  

## 2022-09-06 NOTE — Assessment & Plan Note (Signed)
Lab Results  Component Value Date   HGBA1C 8.2 (H) 08/23/2022   Uncontrolled, but improving On metformin, glipizide and Jardiance, needs to be compliant Advised to follow diabetic diet On statin and ARB F/u CMP and lipid panel Diabetic foot exam: Today Diabetic eye exam: Advised to follow up with Ophthalmology for diabetic eye exam

## 2022-09-06 NOTE — Assessment & Plan Note (Signed)
Takes Atorvastatin 80 mg QD Checked lipid profile

## 2022-09-06 NOTE — Patient Instructions (Signed)
Please continue to take medications as prescribed.  Please continue to follow low carb diet and perform moderate exercise/walking at least 150 mins/week.  Please get fasting blood tests done before the next visit. 

## 2022-09-06 NOTE — Progress Notes (Signed)
Established Patient Office Visit  Subjective:  Patient ID: Gavin Harris, male    DOB: 04/27/1966  Age: 57 y.o. MRN: [id number]  CC:  Chief Complaint  Patient presents with   Annual Exam    HPI Gavin Harris is a 57 y.o. male with past medical history of CAD s/p stent placement, HTN, GERD, type II DM, tobacco abuse, and obesity who presents for annual physical.  CAD and HTN: BP is well-controlled. Takes medications regularly. Patient denies headache, dizziness, chest pain, dyspnea or palpitations.   Type 2 DM: His HbA1c is still elevated at 8.2.  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  He takes Metformin 1000 mg BID, Glipizide 5 mg BID and Jardiance 25 mg currently.  He denies any polyuria or polyphagia.  He has cut down on smoking and reports that he smokes 1 to 2 cigarettes/day.    Past Medical History:  Diagnosis Date   Acute ST elevation myocardial infarction (STEMI) 02/07/2015   CAD (coronary artery disease) 2015   Stent placement   Diabetes mellitus without complication    GERD (gastroesophageal reflux disease)    Hypercholesteremia    Hypertension    Myocardial infarction 2014    Past Surgical History:  Procedure Laterality Date   COLONOSCOPY  05/11/2015   [facility] specialist; tubular adenoma and hyperplastic polyp.   COLONOSCOPY WITH PROPOFOL N/A 08/29/2020   Procedure: COLONOSCOPY WITH PROPOFOL;  Surgeon: Lanelle Bal, DO;  Location: [facility];  Service: Endoscopy;  Laterality: N/A;  AM (diabetic)   CORONARY ANGIOPLASTY WITH STENT PLACEMENT  2015   POLYPECTOMY  08/29/2020   Procedure: POLYPECTOMY;  Surgeon: Lanelle Bal, DO;  Location: [facility];  Service: Endoscopy;;   tumor in chest removed     at age 25.     Family History  Problem Relation Age of Onset   Breast cancer Mother    Lung cancer Father    Colon cancer Neg Hx     Social  History   Socioeconomic History   Marital status: Single    Spouse name: Not on file   Number of children: 3   Years of education: Not on file   Highest education level: Not on file  Occupational History   Occupation: truck driver  Tobacco Use   Smoking status: Some Days    Packs/day: 0.75    Years: 30.00    Additional pack years: 0.00    Total pack years: 22.50    Types: Cigarettes    Last attempt to quit: 04/14/2021    Years since quitting: 1.3    Passive exposure: Never   Smokeless tobacco: Never  Vaping Use   Vaping Use: Never used  Substance and Sexual Activity   Alcohol use: Not Currently    Comment: Quit 3 years ago.  (06/16/2020)   Drug use: Not Currently    Types: Marijuana, Cocaine    Comment: 30 years ago.    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   Social Determinants of Health   Financial Resource Strain: Not on file  Food Insecurity: Not on file  Transportation Needs: Not on file  Physical Activity: Not on file  Stress: Not on file  Social Connections: Not on file  Intimate Partner Violence:  Not on file    Outpatient Medications Prior to Visit  Medication Sig Dispense Refill   aspirin 81 MG EC tablet Take 81 mg by mouth daily.     empagliflozin (JARDIANCE) 25 MG TABS tablet Take 1 tablet (25 mg total) by mouth daily. 90 tablet 3   Multiple Vitamin (MULTI-VITAMIN) tablet Take 1 tablet by mouth daily.     OVER THE COUNTER MEDICATION Take 3,000 mg by mouth daily. Nugenix Ultimate     rosuvastatin (CRESTOR) 40 MG tablet Take 1 tablet by mouth once daily 90 tablet 1   tadalafil (CIALIS) 20 MG tablet Take 1/2 to 1 tablet p.o. as needed 20 tablet 11   glipiZIDE (GLUCOTROL) 5 MG tablet TAKE 1 TABLET BY MOUTH TWICE DAILY BEFORE A MEAL 180 tablet 0   losartan-hydrochlorothiazide (HYZAAR) 100-12.5 MG tablet Take 1 tablet by mouth once daily 90 tablet  0   metFORMIN (GLUCOPHAGE) 1000 MG tablet TAKE 1 TABLET BY MOUTH TWICE DAILY WITH  MEAL 180 tablet 0   No facility-administered medications prior to visit.    Allergies  Allergen Reactions   Penicillins Hives and Other (See Comments)    Mother told him     ROS Review of Systems  Constitutional:  Negative for chills and fever.  HENT:  Negative for congestion and sore throat.   Eyes:  Negative for pain and discharge.  Respiratory:  Negative for cough and shortness of breath.   Cardiovascular:  Negative for chest pain and palpitations.  Gastrointestinal:  Negative for constipation, diarrhea, nausea and vomiting.  Endocrine: Negative for polydipsia and polyuria.  Genitourinary:  Negative for dysuria and hematuria.  Musculoskeletal:  Negative for neck pain and neck stiffness.  Skin:  Negative for rash.  Neurological:  Negative for dizziness, weakness, numbness and headaches.  Psychiatric/Behavioral:  Negative for agitation and behavioral problems.       Objective:    Physical Exam Vitals reviewed.  Constitutional:      General: He is not in acute distress.    Appearance: He is not diaphoretic.  HENT:     Head: Normocephalic and atraumatic.     Nose: Nose normal.     Mouth/Throat:     Mouth: Mucous membranes are moist.  Eyes:     General: No scleral icterus.    Extraocular Movements: Extraocular movements intact.  Cardiovascular:     Rate and Rhythm: Normal rate and regular rhythm.     Pulses: Normal pulses.     Heart sounds: Normal heart sounds. No murmur heard. Pulmonary:     Breath sounds: Normal breath sounds. No wheezing or rales.  Abdominal:     Palpations: Abdomen is soft.     Tenderness: There is no abdominal tenderness.  Musculoskeletal:     Cervical back: Neck supple. No tenderness.     Right lower leg: No edema.     Left lower leg: No edema.  Skin:    General: Skin is warm.     Findings: No rash.  Neurological:     General: No focal deficit present.      Mental Status: He is alert and oriented to person, place, and time.     Cranial Nerves: No cranial nerve deficit.     Sensory: No sensory deficit.     Motor: No weakness.  Psychiatric:        Mood and Affect: Mood normal.        Behavior: Behavior normal.     BP 124/82 (BP Location:  Right Arm, Patient Position: Sitting, Cuff Size: Large)   Pulse 85   Ht  (1.753 m)   Wt 219 lb 6.4 oz (99.5 kg)   SpO2 96%   BMI 32.40 kg/m  Wt Readings from Last 3 Encounters:  09/06/22 219 lb 6.4 oz (99.5 kg)  03/01/22 214 lb 3.2 oz (97.2 kg)  02/08/22 217 lb 3.2 oz (98.5 kg)    Lab Results  Component Value Date   TSH 1.310 08/23/2022   Lab Results  Component Value Date   WBC 7.0 08/23/2022   HGB 16.5 08/23/2022   HCT 48.0 08/23/2022   MCV 91 08/23/2022   PLT 137 (L) 08/23/2022   Lab Results  Component Value Date   NA 142 08/23/2022   K 4.4 08/23/2022   CO2 21 08/23/2022   GLUCOSE 151 (H) 08/23/2022   BUN 14 08/23/2022   CREATININE 1.01 08/23/2022   BILITOT 0.4 08/23/2022   ALKPHOS 79 08/23/2022   AST 21 08/23/2022   ALT 34 08/23/2022   PROT 6.8 08/23/2022   ALBUMIN 4.4 08/23/2022   CALCIUM 9.4 08/23/2022   ANIONGAP 11 08/25/2020   EGFR 87 08/23/2022   Lab Results  Component Value Date   CHOL 112 08/23/2022   Lab Results  Component Value Date   HDL 32 (L) 08/23/2022   Lab Results  Component Value Date   LDLCALC 59 08/23/2022   Lab Results  Component Value Date   TRIG 116 08/23/2022   Lab Results  Component Value Date   CHOLHDL 3.5 08/23/2022   Lab Results  Component Value Date   HGBA1C 8.2 (H) 08/23/2022      Assessment & Plan:   Problem List Items Addressed This Visit       Cardiovascular and Mediastinum   Coronary artery disease involving native heart without angina pectoris    S/p stent placement in 2016 On Aspirin and statin Followed by Cardiology      Relevant Medications   losartan-hydrochlorothiazide (HYZAAR) 100-12.5 MG tablet    Essential hypertension    BP Readings from Last 1 Encounters:  09/06/22 124/82  Well-controlled Counseled for compliance with the medications Advised DASH diet and moderate exercise/walking, at least 150 mins/week      Relevant Medications   losartan-hydrochlorothiazide (HYZAAR) 100-12.5 MG tablet     Endocrine   Type 2 diabetes mellitus with hyperglycemia, without long-term current use of insulin    Lab Results  Component Value Date   HGBA1C 8.2 (H) 08/23/2022  Uncontrolled, but improving On metformin, glipizide and Jardiance, needs to be compliant Advised to follow diabetic diet On statin and ARB F/u CMP and lipid panel Diabetic foot exam: Today Diabetic eye exam: Advised to follow up with Ophthalmology for diabetic eye exam      Relevant Medications   losartan-hydrochlorothiazide (HYZAAR) 100-12.5 MG tablet   glipiZIDE (GLUCOTROL) 5 MG tablet   metFORMIN (GLUCOPHAGE) 1000 MG tablet     Other   Obesity (BMI 30.0-34.9)    Diet modification and moderate exercise advised      Mixed hyperlipidemia    Takes Atorvastatin 80 mg QD Checked lipid profile      Relevant Medications   losartan-hydrochlorothiazide (HYZAAR) 100-12.5 MG tablet   Encounter for general adult medical examination with abnormal findings - Primary    Physical exam as documented. Counseling done  re healthy lifestyle involving commitment to 150 minutes exercise per week, heart healthy diet, and attaining healthy weight.The importance of adequate sleep also discussed. Changes in  health habits are decided on by the patient with goals and time frames  set for achieving them. Immunization and cancer screening needs are specifically addressed at this visit.      Screening for lung cancer    Has > 20-pack-year smoking history Ordered low-dose CT chest after discussing with the patient.      Relevant Orders   CT CHEST LUNG CANCER SCREENING LOW DOSE WO CONTRAST    Meds ordered this encounter   Medications   losartan-hydrochlorothiazide (HYZAAR) 100-12.5 MG tablet    Sig: Take 1 tablet by mouth daily.    Dispense:  90 tablet    Refill:  3   glipiZIDE (GLUCOTROL) 5 MG tablet    Sig: Take 1 tablet (5 mg total) by mouth 2 (two) times daily before a meal.    Dispense:  180 tablet    Refill:  1   metFORMIN (GLUCOPHAGE) 1000 MG tablet    Sig: Take 1 tablet (1,000 mg total) by mouth 2 (two) times daily with a meal.    Dispense:  180 tablet    Refill:  3    Follow-up: Return in about 4 months (around 01/06/2023) for DM and HTN.    Anabel Halon, MD

## 2022-09-06 NOTE — Assessment & Plan Note (Signed)
S/p stent placement in 2016 On Aspirin and statin Followed by Cardiology 

## 2022-09-06 NOTE — Assessment & Plan Note (Signed)
BP Readings from Last 1 Encounters:  09/06/22 124/82   Well-controlled Counseled for compliance with the medications Advised DASH diet and moderate exercise/walking, at least 150 mins/week

## 2022-09-06 NOTE — Assessment & Plan Note (Signed)

## 2022-09-06 NOTE — Assessment & Plan Note (Signed)
Diet modification and moderate exercise advised. 

## 2022-09-26 ENCOUNTER — Telehealth: Payer: Self-pay | Admitting: *Deleted

## 2022-09-26 NOTE — Telephone Encounter (Signed)
States they are trying to set up a CT scan for the Lung Screen Program and have been for months. Please call @     [facility]ass [phone] Significant Other  OK to call PT as well @ [phone]  Nice lady. Possibly confused.

## 2022-09-30 ENCOUNTER — Other Ambulatory Visit: Payer: Self-pay | Admitting: *Deleted

## 2022-09-30 DIAGNOSIS — Z87891 Personal history of nicotine dependence: Secondary | ICD-10-CM

## 2022-09-30 DIAGNOSIS — Z122 Encounter for screening for malignant neoplasm of respiratory organs: Secondary | ICD-10-CM

## 2022-09-30 DIAGNOSIS — F17210 Nicotine dependence, cigarettes, uncomplicated: Secondary | ICD-10-CM

## 2022-09-30 NOTE — Telephone Encounter (Signed)
Spoke with patient Scheduled SDMV 10/25/22 9:00 CT scheduled 10/25/22 10:30 Pt voiced understanding and had no further questions.

## 2022-10-25 ENCOUNTER — Encounter: Payer: Self-pay | Admitting: Physician Assistant

## 2022-10-25 ENCOUNTER — Ambulatory Visit (INDEPENDENT_AMBULATORY_CARE_PROVIDER_SITE_OTHER): Payer: BC Managed Care – PPO | Admitting: Physician Assistant

## 2022-10-25 ENCOUNTER — Ambulatory Visit (HOSPITAL_COMMUNITY): Admission: RE | Admit: 2022-10-25 | Payer: BC Managed Care – PPO | Source: Ambulatory Visit

## 2022-10-25 DIAGNOSIS — F17210 Nicotine dependence, cigarettes, uncomplicated: Secondary | ICD-10-CM

## 2022-10-25 NOTE — Progress Notes (Signed)
Virtual Visit via Telephone Note  I connected with Micheline Chapman on 10/25/22 at  9:00 AM EDT by telephone and verified that I am speaking with the correct person using two identifiers.  Location: Patient: home Provider: working virtually from home   I discussed the limitations, risks, security and privacy concerns of performing an evaluation and management service by telephone and the availability of in person appointments. I also discussed with the patient that there may be a patient responsible charge related to this service. The patient expressed understanding and agreed to proceed.     Shared Decision Making Visit Lung Cancer Screening Program [phone])   Eligibility: Age 57 y.o. Pack Years Smoking History Calculation 42 (# packs/per year x # years smoked) Recent History of coughing up blood  no Unexplained weight loss? no ( >Than 15 pounds within the last 6 months ) Prior History Lung / other cancer no (Diagnosis within the last 5 years already requiring surveillance chest CT Scans). Smoking Status Current Smoker  Visit Components: Discussion included one or more decision making aids. yes Discussion included risk/benefits of screening. yes Discussion included potential follow up diagnostic testing for abnormal scans. yes Discussion included meaning and risk of over diagnosis. yes Discussion included meaning and risk of False Positives. yes Discussion included meaning of total radiation exposure. yes  Counseling Included: Importance of adherence to annual lung cancer LDCT screening. yes Impact of comorbidities on ability to participate in the program. yes Ability and willingness to under diagnostic treatment. yes  Smoking Cessation Counseling: Current Smokers:  Discussed importance of smoking cessation. yes Information about tobacco cessation classes and interventions provided to patient. yes Symptomatic Patient. no Diagnosis Code: Tobacco Use Z72.0 Asymptomatic  Patient yes  Counseling (Intermediate counseling: > three minutes counseling) B1478 Information about tobacco cessation classes and interventions provided to patient. Yes Written Order for Lung Cancer Screening with LDCT placed in Epic. Yes (CT Chest Lung Cancer Screening Low Dose W/O CM) [id number] Z12.2-Screening of respiratory organs Z87.891-Personal history of nicotine dependence   I have spent 25 minutes of face to face/ virtual visit  time with the patient discussing the risks and benefits of lung cancer screening. We discussed the above noted topics. We paused at intervals to allow for questions to be asked and answered to ensure understanding.We discussed that the single most powerful action that anyone can take to decrease their risk of developing lung cancer is to quit smoking.  We discussed options for tools to aid in quitting smoking including nicotine replacement therapy, non-nicotine medications, support groups, Quit Smart classes, and behavior modification. We discussed that often times setting smaller, more achievable goals, such as eliminating 1 cigarette a day for a week and then 2 cigarettes a day for a week can be helpful in slowly decreasing the number of cigarettes smoked. I provided  them  with smoking cessation  information  with contact information for community resources, classes, free nicotine replacement therapy, and access to mobile apps, text messaging, and on-line smoking cessation help. I have also provided  them  the office contact information in the event they have any questions. We discussed the time and location of the scan, and that either Abigail Miyamoto RN, Karlton Lemon, RN  or I will call / send a letter with the results within 24-72 hours of receiving them. The patient verbalized understanding of all of  the above and had no further questions upon leaving the office. They have my contact information in the event they  have any further questions.  I spent two minutes  counseling on smoking cessation and the health risks of continued tobacco abuse.  I explained to the patient that there has been a high incidence of coronary artery disease noted on these exams. I explained that this is a non-gated exam therefore degree or severity cannot be determined. This patient is on statin therapy. I have asked the patient to follow-up with their PCP regarding any incidental finding of coronary artery disease and management with diet or medication as their PCP  feels is clinically indicated. The patient verbalized understanding of the above and had no further questions upon completion of the visit.    Darcella Gasman Mikel Pyon, PA-C

## 2022-10-25 NOTE — Patient Instructions (Signed)
Thank you for participating in the [facility]. It was our pleasure to meet you today. We will call you with the results of your scan within the next few days. Your scan will be assigned a Lung RADS category score by the physicians reading the scans.  This Lung RADS score determines follow up scanning.  See below for description of categories, and follow up screening recommendations. We will be in touch to schedule your follow up screening annually or based on recommendations of our providers. We will fax a copy of your scan results to your Primary Care Physician, or the physician who referred you to the program, to ensure they have the results. Please call the office if you have any questions or concerns regarding your scanning experience or results.  Our office number is [phone]. Please speak with Denise Phelps, RN. , or  Denise Buckner RN, They are  our Lung Cancer Screening RN.'s If They are unavailable when you call, Please leave a message on the voice mail. We will return your call at our earliest convenience.This voice mail is monitored several times a day.  Remember, if your scan is normal, we will scan you annually as long as you continue to meet the criteria for the program. (Age 50-80, Current smoker or smoker who has quit within the last 15 years). If you are a smoker, remember, quitting is the single most powerful action that you can take to decrease your risk of lung cancer and other pulmonary, breathing related problems. We know quitting is hard, and we are here to help.  Please let us know if there is anything we can do to help you meet your goal of quitting. If you are a former smoker, congratulations. We are proud of you! Remain smoke free! Remember you can refer friends or family members through the number above.  We will screen them to make sure they meet criteria for the program. Thank you for helping us take better care of you by  participating in Lung Screening.  You can receive free nicotine replacement therapy ( patches, gum or mints) by calling 1-800-QUIT NOW. Please call so we can get you on the path to becoming  a non-smoker. I know it is hard, but you can do this!  Lung RADS Categories:  Lung RADS 1: no nodules or definitely non-concerning nodules.  Recommendation is for a repeat annual scan in 12 months.  Lung RADS 2:  nodules that are non-concerning in appearance and behavior with a very low likelihood of becoming an active cancer. Recommendation is for a repeat annual scan in 12 months.  Lung RADS 3: nodules that are probably non-concerning , includes nodules with a low likelihood of becoming an active cancer.  Recommendation is for a 6-month repeat screening scan. Often noted after an upper respiratory illness. We will be in touch to make sure you have no questions, and to schedule your 6-month scan.  Lung RADS 4 A: nodules with concerning findings, recommendation is most often for a follow up scan in 3 months or additional testing based on our provider's assessment of the scan. We will be in touch to make sure you have no questions and to schedule the recommended 3 month follow up scan.  Lung RADS 4 B:  indicates findings that are concerning. We will be in touch with you to schedule additional diagnostic testing based on our provider's  assessment of the scan.  Other options for assistance in smoking cessation (   As covered by your insurance benefits)  Hypnosis for smoking cessation  [employer] [phone]  Acupuncture for smoking cessation  [facility] [phone]   

## 2022-11-08 ENCOUNTER — Ambulatory Visit (HOSPITAL_COMMUNITY): Payer: Managed Care, Other (non HMO)

## 2022-12-01 ENCOUNTER — Encounter: Payer: Self-pay | Admitting: Internal Medicine

## 2023-01-07 ENCOUNTER — Telehealth: Payer: Self-pay | Admitting: Internal Medicine

## 2023-01-07 NOTE — Telephone Encounter (Signed)
FYI Tammy Reavis Significant other called to cancel patient moved.

## 2023-01-10 ENCOUNTER — Ambulatory Visit: Payer: BC Managed Care – PPO | Admitting: Internal Medicine

## 2023-01-28 ENCOUNTER — Telehealth: Payer: Self-pay | Admitting: *Deleted

## 2023-01-28 NOTE — Telephone Encounter (Signed)
Left message for pt to call back to schedule lung screening CT. (Will need to be scheduled at [facility] imaging facility per insurance).

## 2023-03-04 ENCOUNTER — Telehealth: Payer: Self-pay | Admitting: *Deleted

## 2023-03-04 NOTE — Telephone Encounter (Signed)
Left message for pt to call to reschedule lung screening CT at a [facility] imaging location as [facility] is not approved by his insurance.

## 2023-03-07 ENCOUNTER — Ambulatory Visit (HOSPITAL_COMMUNITY): Payer: Managed Care, Other (non HMO)

## 2023-04-01 ENCOUNTER — Other Ambulatory Visit: Payer: Self-pay | Admitting: Internal Medicine

## 2023-04-01 DIAGNOSIS — E1165 Type 2 diabetes mellitus with hyperglycemia: Secondary | ICD-10-CM

## 2023-05-05 ENCOUNTER — Other Ambulatory Visit: Payer: Self-pay | Admitting: Internal Medicine

## 2023-05-05 DIAGNOSIS — E1165 Type 2 diabetes mellitus with hyperglycemia: Secondary | ICD-10-CM

## 2023-05-20 ENCOUNTER — Telehealth: Payer: Self-pay | Admitting: Cardiology

## 2023-05-20 NOTE — Telephone Encounter (Signed)
Called pt to schedule follow up. PT stated he has a new Dr.

## 2023-08-10 ENCOUNTER — Other Ambulatory Visit: Payer: Self-pay | Admitting: Internal Medicine

## 2023-08-10 DIAGNOSIS — E1165 Type 2 diabetes mellitus with hyperglycemia: Secondary | ICD-10-CM

## 2023-08-11 ENCOUNTER — Other Ambulatory Visit: Payer: Self-pay | Admitting: Internal Medicine

## 2023-08-11 DIAGNOSIS — E1165 Type 2 diabetes mellitus with hyperglycemia: Secondary | ICD-10-CM
